# Patient Record
Sex: Female | Born: 1935 | Race: White | Hispanic: No | Smoking: Former smoker
Health system: Southern US, Community
[De-identification: ages and names within clinical notes are randomized; demographics above are authoritative.]

## PROBLEM LIST (undated history)

## (undated) DIAGNOSIS — M1990 Unspecified osteoarthritis, unspecified site: Secondary | ICD-10-CM

## (undated) DIAGNOSIS — K649 Unspecified hemorrhoids: Secondary | ICD-10-CM

## (undated) DIAGNOSIS — I519 Heart disease, unspecified: Secondary | ICD-10-CM

## (undated) DIAGNOSIS — C801 Malignant (primary) neoplasm, unspecified: Secondary | ICD-10-CM

## (undated) DIAGNOSIS — H269 Unspecified cataract: Secondary | ICD-10-CM

## (undated) DIAGNOSIS — J449 Chronic obstructive pulmonary disease, unspecified: Secondary | ICD-10-CM

## (undated) DIAGNOSIS — M810 Age-related osteoporosis without current pathological fracture: Secondary | ICD-10-CM

## (undated) DIAGNOSIS — I10 Essential (primary) hypertension: Secondary | ICD-10-CM

## (undated) DIAGNOSIS — N200 Calculus of kidney: Secondary | ICD-10-CM

## (undated) DIAGNOSIS — G4700 Insomnia, unspecified: Secondary | ICD-10-CM

## (undated) DIAGNOSIS — K30 Functional dyspepsia: Secondary | ICD-10-CM

## (undated) HISTORY — PX: CHOLECYSTECTOMY: SHX55

## (undated) HISTORY — DX: Unspecified hemorrhoids: K64.9

## (undated) HISTORY — DX: Unspecified cataract: H26.9

## (undated) HISTORY — PX: ABDOMINAL HYSTERECTOMY: SHX81

## (undated) HISTORY — DX: Heart disease, unspecified: I51.9

## (undated) HISTORY — DX: Unspecified osteoarthritis, unspecified site: M19.90

## (undated) HISTORY — PX: OTHER SURGICAL HISTORY: SHX169

## (undated) HISTORY — PX: COLONOSCOPY: SHX174

## (undated) HISTORY — DX: Age-related osteoporosis without current pathological fracture: M81.0

## (undated) HISTORY — PX: BREAST BIOPSY: SHX20

## (undated) HISTORY — PX: APPENDECTOMY: SHX54

## (undated) HISTORY — PX: COLON RESECTION: SHX5231

## (undated) HISTORY — DX: Insomnia, unspecified: G47.00

## (undated) HISTORY — PX: TONSILLECTOMY: SUR1361

## (undated) HISTORY — DX: Functional dyspepsia: K30

## (undated) HISTORY — DX: Chronic obstructive pulmonary disease, unspecified: J44.9

---

## 2004-08-11 ENCOUNTER — Ambulatory Visit: Payer: Self-pay | Admitting: Internal Medicine

## 2005-06-27 ENCOUNTER — Ambulatory Visit: Payer: Self-pay | Admitting: Internal Medicine

## 2005-09-04 ENCOUNTER — Ambulatory Visit: Payer: Self-pay | Admitting: Internal Medicine

## 2005-11-30 ENCOUNTER — Ambulatory Visit: Payer: Self-pay

## 2005-12-28 ENCOUNTER — Ambulatory Visit: Payer: Self-pay | Admitting: Internal Medicine

## 2006-09-25 ENCOUNTER — Ambulatory Visit: Payer: Self-pay | Admitting: Internal Medicine

## 2007-01-01 ENCOUNTER — Ambulatory Visit: Payer: Self-pay | Admitting: Unknown Physician Specialty

## 2007-01-07 ENCOUNTER — Ambulatory Visit: Payer: Self-pay | Admitting: Unknown Physician Specialty

## 2007-01-31 ENCOUNTER — Ambulatory Visit: Payer: Self-pay | Admitting: Surgery

## 2007-01-31 ENCOUNTER — Other Ambulatory Visit: Payer: Self-pay

## 2007-02-04 ENCOUNTER — Inpatient Hospital Stay: Payer: Self-pay | Admitting: Surgery

## 2007-09-26 ENCOUNTER — Ambulatory Visit: Payer: Self-pay | Admitting: Internal Medicine

## 2008-09-27 ENCOUNTER — Ambulatory Visit: Payer: Self-pay | Admitting: Internal Medicine

## 2008-11-09 ENCOUNTER — Ambulatory Visit: Payer: Self-pay | Admitting: Unknown Physician Specialty

## 2009-09-29 ENCOUNTER — Ambulatory Visit: Payer: Self-pay | Admitting: Internal Medicine

## 2009-11-15 ENCOUNTER — Ambulatory Visit: Payer: Self-pay | Admitting: Unknown Physician Specialty

## 2010-10-03 ENCOUNTER — Ambulatory Visit: Payer: Self-pay | Admitting: Internal Medicine

## 2010-11-10 ENCOUNTER — Ambulatory Visit: Payer: Self-pay | Admitting: Unknown Physician Specialty

## 2011-05-27 ENCOUNTER — Emergency Department: Payer: Self-pay | Admitting: Emergency Medicine

## 2011-05-27 LAB — URINALYSIS, COMPLETE
Bilirubin,UR: NEGATIVE
Glucose,UR: NEGATIVE mg/dL (ref 0–75)
Leukocyte Esterase: NEGATIVE
Nitrite: NEGATIVE
Ph: 6 (ref 4.5–8.0)
Protein: 100
Specific Gravity: 1.015 (ref 1.003–1.030)
Squamous Epithelial: NONE SEEN
WBC UR: 46 /HPF (ref 0–5)

## 2011-05-27 LAB — CBC
HGB: 13.5 g/dL (ref 12.0–16.0)
MCV: 98 fL (ref 80–100)

## 2011-05-27 LAB — COMPREHENSIVE METABOLIC PANEL
Albumin: 4.1 g/dL (ref 3.4–5.0)
Alkaline Phosphatase: 67 U/L (ref 50–136)
Anion Gap: 8 (ref 7–16)
BUN: 22 mg/dL — ABNORMAL HIGH (ref 7–18)
Chloride: 105 mmol/L (ref 98–107)
Creatinine: 1.09 mg/dL (ref 0.60–1.30)
EGFR (Non-African Amer.): 46 — ABNORMAL LOW
Osmolality: 284 (ref 275–301)
Potassium: 4.3 mmol/L (ref 3.5–5.1)
SGOT(AST): 21 U/L (ref 15–37)
Sodium: 140 mmol/L (ref 136–145)
Total Protein: 7.4 g/dL (ref 6.4–8.2)

## 2011-05-30 ENCOUNTER — Ambulatory Visit: Payer: Self-pay | Admitting: Urology

## 2011-06-13 ENCOUNTER — Ambulatory Visit: Payer: Self-pay | Admitting: Urology

## 2011-10-04 ENCOUNTER — Ambulatory Visit: Payer: Self-pay | Admitting: Internal Medicine

## 2012-01-15 ENCOUNTER — Ambulatory Visit: Payer: Self-pay | Admitting: Unknown Physician Specialty

## 2012-10-06 ENCOUNTER — Ambulatory Visit: Payer: Self-pay | Admitting: Internal Medicine

## 2013-01-14 ENCOUNTER — Ambulatory Visit: Payer: Self-pay | Admitting: Unknown Physician Specialty

## 2013-05-06 DIAGNOSIS — I059 Rheumatic mitral valve disease, unspecified: Secondary | ICD-10-CM | POA: Insufficient documentation

## 2013-05-06 DIAGNOSIS — I10 Essential (primary) hypertension: Secondary | ICD-10-CM | POA: Insufficient documentation

## 2013-05-06 DIAGNOSIS — J449 Chronic obstructive pulmonary disease, unspecified: Secondary | ICD-10-CM | POA: Insufficient documentation

## 2013-06-14 ENCOUNTER — Emergency Department: Payer: Self-pay

## 2013-06-14 LAB — BASIC METABOLIC PANEL
Anion Gap: 3 — ABNORMAL LOW (ref 7–16)
BUN: 26 mg/dL — AB (ref 7–18)
CHLORIDE: 108 mmol/L — AB (ref 98–107)
CO2: 28 mmol/L (ref 21–32)
Calcium, Total: 8.6 mg/dL (ref 8.5–10.1)
Creatinine: 1.07 mg/dL (ref 0.60–1.30)
EGFR (African American): 53 — ABNORMAL LOW
GFR CALC NON AF AMER: 46 — AB
GLUCOSE: 99 mg/dL (ref 65–99)
OSMOLALITY: 282 (ref 275–301)
Potassium: 4.1 mmol/L (ref 3.5–5.1)
SODIUM: 139 mmol/L (ref 136–145)

## 2013-06-14 LAB — CBC
HCT: 37.1 % (ref 35.0–47.0)
HGB: 12.4 g/dL (ref 12.0–16.0)
MCH: 32.5 pg (ref 26.0–34.0)
MCHC: 33.5 g/dL (ref 32.0–36.0)
MCV: 97 fL (ref 80–100)
Platelet: 159 10*3/uL (ref 150–440)
RBC: 3.81 10*6/uL (ref 3.80–5.20)
RDW: 13.6 % (ref 11.5–14.5)
WBC: 7.3 10*3/uL (ref 3.6–11.0)

## 2013-06-14 LAB — URINALYSIS, COMPLETE
BACTERIA: NONE SEEN
BILIRUBIN, UR: NEGATIVE
Blood: NEGATIVE
Glucose,UR: NEGATIVE mg/dL (ref 0–75)
Ketone: NEGATIVE
Nitrite: NEGATIVE
PROTEIN: NEGATIVE
Ph: 6 (ref 4.5–8.0)
Specific Gravity: 1.011 (ref 1.003–1.030)
Squamous Epithelial: <1

## 2013-07-23 ENCOUNTER — Ambulatory Visit: Payer: Self-pay | Admitting: Internal Medicine

## 2013-08-04 DIAGNOSIS — M179 Osteoarthritis of knee, unspecified: Secondary | ICD-10-CM | POA: Insufficient documentation

## 2013-10-12 ENCOUNTER — Ambulatory Visit: Payer: Self-pay | Admitting: Internal Medicine

## 2013-11-18 DIAGNOSIS — M810 Age-related osteoporosis without current pathological fracture: Secondary | ICD-10-CM | POA: Insufficient documentation

## 2014-01-08 DIAGNOSIS — E785 Hyperlipidemia, unspecified: Secondary | ICD-10-CM | POA: Insufficient documentation

## 2014-11-30 ENCOUNTER — Emergency Department
Admission: EM | Admit: 2014-11-30 | Discharge: 2014-11-30 | Disposition: A | Discharge status: Home / Self Care | Payer: Medicare Other | Attending: Emergency Medicine | Admitting: Emergency Medicine

## 2014-11-30 ENCOUNTER — Encounter: Payer: Self-pay | Admitting: Emergency Medicine

## 2014-11-30 DIAGNOSIS — I10 Essential (primary) hypertension: Secondary | ICD-10-CM | POA: Insufficient documentation

## 2014-11-30 DIAGNOSIS — M79602 Pain in left arm: Secondary | ICD-10-CM | POA: Diagnosis not present

## 2014-11-30 HISTORY — DX: Essential (primary) hypertension: I10

## 2014-11-30 HISTORY — DX: Calculus of kidney: N20.0

## 2014-11-30 MED ORDER — IBUPROFEN 400 MG PO TABS
400.0000 mg | ORAL_TABLET | Freq: Once | ORAL | Status: DC
Start: 2014-11-30 — End: 2014-12-01

## 2014-11-30 NOTE — Discharge Instructions (Signed)

## 2014-11-30 NOTE — ED Notes (Signed)
Pt presents to ED with left arm pain. Pt states she was sitting at her kitchen table earlier this evening and rested her left forearm on the edge of the table; pt states she immediately felt pain in her arm.  Pt states she "isn't sure if pain was from the bone or the muscle" but denies hx of the same. Pt states when she looked at her arm after it started to hurt she felt that the vein in that arm appeared to "stick out further than the one on the right" and that her neighbor was concerned about a blood clot. No redness, bruising, or warmth noted. Area painful to touch.

## 2014-11-30 NOTE — ED Notes (Signed)
MD Williams at bedside.  

## 2014-11-30 NOTE — ED Provider Notes (Signed)
[facility] Emergency Department Provider Note     Time seen: ----------------------------------------- 10:20 PM on 11/30/2014 -----------------------------------------    I have reviewed the triage vital signs and the nursing notes.   HISTORY  Chief Complaint Arm Pain    HPI Stacy Holloway is a 81 y.o. female who presents to ER for left arm pain. Patient states she was sitting in her kitchen table earlier this evening resting her left forearm on edge the table and medially she felt pain in her left arm. She wasn't sure if it was in the bone of the muscle, she was concerned this may have had something is due with a heart attack or stroke. She felt the vein in her forearm stick out further than normal she was concerned about blood clot. She denies any other complaints   Past Medical History  Diagnosis Date  . Hypertension   . Kidney stone     There are no active problems to display for this patient.   Past Surgical History  Procedure Laterality Date  . Colon cancer    . Cholecystectomy    . Appendectomy    . Tonsillectomy    . Abdominal hysterectomy      Allergies Sulfa antibiotics  Social History Social History  Substance Use Topics  . Smoking status: Never Smoker   . Smokeless tobacco: None  . Alcohol Use: No    Review of Systems Constitutional: Negative for fever. Eyes: Negative for visual changes. ENT: Negative for sore throat. Cardiovascular: Negative for chest pain. Respiratory: Negative for shortness of breath. Gastrointestinal: Negative for abdominal pain, vomiting and diarrhea. Genitourinary: Negative for dysuria. Musculoskeletal: Positive for left forearm pain Skin: Negative for rash. Neurological: Negative for headaches, focal weakness or numbness.  10-point ROS otherwise negative.  ____________________________________________   PHYSICAL EXAM:  VITAL SIGNS: ED Triage Vitals  Enc Vitals Group     BP 11/30/14  2203 144/66 mmHg     Pulse Rate 11/30/14 2203 59     Resp 11/30/14 2203 18     Temp 11/30/14 2203 97.6 F (36.4 C)     Temp Source 11/30/14 2203 Oral     SpO2 11/30/14 2203 98 %     Weight 11/30/14 2203 167 lb (75.751 kg)     Height 11/30/14 2203 5\' 1"  (1.549 m)     Head Cir --      Peak Flow --      Pain Score 11/30/14 2204 8     Pain Loc --      Pain Edu? --      Excl. in [place]? --     Constitutional: Alert and oriented. Well appearing and in no distress. Eyes: Conjunctivae are normal. PERRL. Normal extraocular movements. ENT   Head: Normocephalic and atraumatic.   Nose: No congestion/rhinnorhea.   Mouth/Throat: Mucous membranes are moist.   Neck: No stridor. Cardiovascular: Normal rate, regular rhythm. Normal and symmetric distal pulses are present in all extremities. No murmurs, rubs, or gallops. Respiratory: Normal respiratory effort without tachypnea nor retractions. Breath sounds are clear and equal bilaterally. No wheezes/rales/rhonchi. Gastrointestinal: Soft and nontender. No distention. No abdominal bruits.  Musculoskeletal: Nontender with normal range of motion in all extremities. No joint effusions.  No lower extremity tenderness nor edema. Neurologic:  Normal speech and language. No gross focal neurologic deficits are appreciated. Speech is normal. No gait instability. Skin:  Skin is warm, dry and intact. No rash noted. Psychiatric: Mood and affect are normal. Speech and  behavior are normal. Patient exhibits appropriate insight and judgment. ____________________________________________  EKG: Interpreted by me. Normal sinus rhythm with a rate of 61 bpm, PACs, normal PR interval, normal QRS with, normal QT interval.  ____________________________________________  ED COURSE:  Pertinent labs & imaging results that were available during my care of the patient were reviewed by me and considered in my medical decision making (see chart for details). Patient has a  normal examination, just needs reassurance. She does not have signs or symptoms of anything emergent.  ____________________________________________  FINAL ASSESSMENT AND PLAN  Left arm pain  Plan: Patient with labs and imaging as dictated above. Patient points to an area on the dorsal aspect of her left forearm distally that is sore when you touch it. It is normal on my examination. She is stable for outpatient follow-up.   Earleen Newport, MD   Earleen Newport, MD 11/30/14 [id number]

## 2014-12-28 ENCOUNTER — Other Ambulatory Visit: Payer: Self-pay | Admitting: Internal Medicine

## 2014-12-28 DIAGNOSIS — Z1231 Encounter for screening mammogram for malignant neoplasm of breast: Secondary | ICD-10-CM

## 2015-01-12 ENCOUNTER — Ambulatory Visit
Admission: RE | Admit: 2015-01-12 | Discharge: 2015-01-12 | Disposition: A | Discharge status: Home / Self Care | Payer: Medicare Other | Source: Ambulatory Visit | Attending: Internal Medicine | Admitting: Internal Medicine

## 2015-01-12 DIAGNOSIS — Z1231 Encounter for screening mammogram for malignant neoplasm of breast: Secondary | ICD-10-CM

## 2015-01-12 HISTORY — DX: Malignant (primary) neoplasm, unspecified: C80.1

## 2015-12-04 DIAGNOSIS — D696 Thrombocytopenia, unspecified: Secondary | ICD-10-CM | POA: Insufficient documentation

## 2015-12-04 NOTE — Progress Notes (Signed)
[place]  Telephone:(336) [phone] Fax:(336) [phone]  ID: Stacy Holloway OB: 07-09-1921  MR#: [id number]  KPT#:[id number]  No care team member to display  CHIEF COMPLAINT: Lymphocytosis, thrombocytopenia  INTERVAL HISTORY: Patient is a 82 year old female who was found to have an elevated white blood cell count with a lymphocytic predominance as well as thrombocytopenia on routine blood work. She is anxious, but otherwise feels well. She has no neurologic complaints. She denies any recent fevers or illnesses. She has a good appetite and denies weight loss. She denies any night sweats. She has no chest pain or shortness of breath. She denies any nausea, vomiting, constipation, or diarrhea. She has no urinary complaints. Patient feels at her baseline and offers no specific complaints today.  REVIEW OF SYSTEMS:   Review of Systems  Constitutional: Negative.  Negative for fever, malaise/fatigue and weight loss.  Respiratory: Negative.  Negative for cough and shortness of breath.   Cardiovascular: Negative.  Negative for chest pain and leg swelling.  Gastrointestinal: Negative.   Genitourinary: Negative.   Musculoskeletal: Negative.   Neurological: Negative for weakness.  Psychiatric/Behavioral: The patient is nervous/anxious.     As per HPI. Otherwise, a complete review of systems is negative.  PAST MEDICAL HISTORY: Past Medical History:  Diagnosis Date  . Arthritis   . Cancer [facility])    colon cancer 2009  . Cataract   . COPD (chronic obstructive pulmonary disease) ([place])   . Heart disease   . Hemorrhoids   . Hypertension   . Indigestion   . Insomnia   . Kidney stone   . Osteoporosis     PAST SURGICAL HISTORY: Past Surgical History:  Procedure Laterality Date  . ABDOMINAL HYSTERECTOMY    . APPENDECTOMY    . BREAST BIOPSY Left    negative 1982  . CHOLECYSTECTOMY    . colon cancer    . COLON RESECTION    . COLONOSCOPY  2008, 2010  . TONSILLECTOMY        FAMILY HISTORY: Family History  Problem Relation Age of Onset  . Breast cancer Neg Hx     ADVANCED DIRECTIVES (Y/N):  N  HEALTH MAINTENANCE: Social History  Substance Use Topics  . Smoking status: Former Research scientist (life sciences)  . Smokeless tobacco: Never Used  . Alcohol use No     Colonoscopy:  PAP:  Bone density:  Lipid panel:  Allergies  Allergen Reactions  . Other Shortness Of Breath  . Sulfa Antibiotics Anaphylaxis    Current Outpatient Prescriptions  Medication Sig Dispense Refill  . aspirin EC 81 MG tablet Take 81 mg by mouth daily.    . carvedilol (COREG) 6.25 MG tablet Take 6.25 mg by mouth 2 (two) times daily with a meal.    . Cholecalciferol (VITAMIN D3) 400 units CAPS Take 2 capsules by mouth daily.    Marland Kitchen docusate sodium (COLACE) 100 MG capsule Take 100 mg by mouth daily as needed for mild constipation.    . furosemide (LASIX) 20 MG tablet Take 20 mg by mouth as needed.    . pantoprazole (PROTONIX) 40 MG tablet Take 40 mg by mouth daily as needed.    . ramipril (ALTACE) 5 MG capsule Take 5 mg by mouth daily.    . vitamin B-12 (CYANOCOBALAMIN) 1000 MCG tablet Take 1,000 mcg by mouth daily.     No current facility-administered medications for this visit.     OBJECTIVE: Vitals:   12/06/15 1133  BP: 138/63  Pulse: 61  Resp:  18  Temp: 97.8 F (36.6 C)     Body mass index is 31.78 kg/m.    ECOG FS:0 - Asymptomatic  General: Well-developed, well-nourished, no acute distress. Eyes: Pink conjunctiva, anicteric sclera. HEENT: Normocephalic, moist mucous membranes, clear oropharnyx. Lungs: Clear to auscultation bilaterally. Heart: Regular rate and rhythm. No rubs, murmurs, or gallops. Abdomen: Soft, nontender, nondistended. No organomegaly noted, normoactive bowel sounds. Musculoskeletal: No edema, cyanosis, or clubbing. Neuro: Alert, answering all questions appropriately. Cranial nerves grossly intact. Skin: No rashes or petechiae noted. Psych: Normal  affect. Lymphatics: No cervical, calvicular, axillary or inguinal LAD.   LAB RESULTS:  Lab Results  Component Value Date   NA 139 06/14/2013   K 4.1 06/14/2013   CL 108 (H) 06/14/2013   CO2 28 06/14/2013   GLUCOSE 99 06/14/2013   BUN 26 (H) 06/14/2013   CREATININE 1.07 06/14/2013   CALCIUM 8.6 06/14/2013   PROT 7.4 05/27/2011   ALBUMIN 4.1 05/27/2011   AST 21 05/27/2011   ALT 22 05/27/2011   ALKPHOS 67 05/27/2011   BILITOT 0.9 05/27/2011   GFRNONAA 46 (L) 06/14/2013   GFRAA 53 (L) 06/14/2013    Lab Results  Component Value Date   WBC 21.2 (H) 12/06/2015   NEUTROABS 2.0 12/06/2015   HGB 11.5 (L) 12/06/2015   HCT 34.1 (L) 12/06/2015   MCV 99.7 12/06/2015   PLT 171 12/06/2015     STUDIES: No results found.  ASSESSMENT: Lymphocytosis, thrombocytopenia  PLAN:   1. Lymphocytosis: Patient has an elevated white blood cell count with lymphocytic predominance. She possibly has underlying CLL and peripheral blood flow cytometry was ordered and is pending at time of dictation. No further intervention is needed at this time. Patient does not require bone marrow biopsy. Return to clinic in 2 months with repeat laboratory work and further evaluation. 2. Thrombocytopenia: Resolved.  Approximately 35 minutes was spent in discussion of which greater than 50% was consultation.  Patient expressed understanding and was in agreement with this plan. She also understands that She can call clinic at any time with any questions, concerns, or complaints.   No matching staging information was found for the patient.  Lloyd Huger, MD   12/07/2015 5:08 PM

## 2015-12-06 ENCOUNTER — Encounter (INDEPENDENT_AMBULATORY_CARE_PROVIDER_SITE_OTHER): Payer: Self-pay

## 2015-12-06 ENCOUNTER — Inpatient Hospital Stay: Payer: Medicare Other | Attending: Oncology | Admitting: Oncology

## 2015-12-06 ENCOUNTER — Encounter: Payer: Self-pay | Admitting: Oncology

## 2015-12-06 ENCOUNTER — Inpatient Hospital Stay: Payer: Medicare Other

## 2015-12-06 VITALS — BP 138/63 | HR 61 | Temp 97.8°F | Resp 18 | Wt 168.2 lb

## 2015-12-06 DIAGNOSIS — Z87442 Personal history of urinary calculi: Secondary | ICD-10-CM | POA: Insufficient documentation

## 2015-12-06 DIAGNOSIS — I10 Essential (primary) hypertension: Secondary | ICD-10-CM

## 2015-12-06 DIAGNOSIS — F419 Anxiety disorder, unspecified: Secondary | ICD-10-CM | POA: Insufficient documentation

## 2015-12-06 DIAGNOSIS — M1990 Unspecified osteoarthritis, unspecified site: Secondary | ICD-10-CM | POA: Diagnosis not present

## 2015-12-06 DIAGNOSIS — D696 Thrombocytopenia, unspecified: Secondary | ICD-10-CM | POA: Diagnosis not present

## 2015-12-06 DIAGNOSIS — J449 Chronic obstructive pulmonary disease, unspecified: Secondary | ICD-10-CM | POA: Insufficient documentation

## 2015-12-06 DIAGNOSIS — D72820 Lymphocytosis (symptomatic): Secondary | ICD-10-CM

## 2015-12-06 DIAGNOSIS — Z7982 Long term (current) use of aspirin: Secondary | ICD-10-CM | POA: Diagnosis not present

## 2015-12-06 DIAGNOSIS — Z87891 Personal history of nicotine dependence: Secondary | ICD-10-CM

## 2015-12-06 DIAGNOSIS — Z79899 Other long term (current) drug therapy: Secondary | ICD-10-CM | POA: Insufficient documentation

## 2015-12-06 LAB — CBC WITH DIFFERENTIAL/PLATELET
BASOS ABS: 0.1 10*3/uL (ref 0–0.1)
BASOS PCT: 0 %
EOS ABS: 0.1 10*3/uL (ref 0–0.7)
Eosinophils Relative: 0 %
HCT: 34.1 % — ABNORMAL LOW (ref 35.0–47.0)
HEMOGLOBIN: 11.5 g/dL — AB (ref 12.0–16.0)
Lymphocytes Relative: 86 %
Lymphs Abs: 18.3 10*3/uL — ABNORMAL HIGH (ref 1.0–3.6)
MCH: 33.6 pg (ref 26.0–34.0)
MCHC: 33.7 g/dL (ref 32.0–36.0)
MCV: 99.7 fL (ref 80.0–100.0)
Monocytes Absolute: 0.8 10*3/uL (ref 0.2–0.9)
Monocytes Relative: 4 %
NEUTROS PCT: 10 %
Neutro Abs: 2 10*3/uL (ref 1.4–6.5)
Platelets: 171 10*3/uL (ref 150–440)
RBC: 3.42 MIL/uL — AB (ref 3.80–5.20)
RDW: 14.4 % (ref 11.5–14.5)
WBC: 21.2 10*3/uL — AB (ref 3.6–11.0)

## 2015-12-06 LAB — IRON AND TIBC
Iron: 80 ug/dL (ref 28–170)
SATURATION RATIOS: 26 % (ref 10.4–31.8)
TIBC: 307 ug/dL (ref 250–450)
UIBC: 227 ug/dL

## 2015-12-06 LAB — FOLATE: FOLATE: 23 ng/mL (ref >5.9)

## 2015-12-06 LAB — LACTATE DEHYDROGENASE: LDH: 174 U/L (ref 98–192)

## 2015-12-06 LAB — FERRITIN: Ferritin: 47 ng/mL (ref 11–307)

## 2015-12-06 LAB — VITAMIN B12: VITAMIN B 12: 1227 pg/mL — AB (ref 180–914)

## 2015-12-06 NOTE — Progress Notes (Signed)
New evaluation for thrombocytopenia. Offers no complaints.

## 2015-12-07 DIAGNOSIS — D72820 Lymphocytosis (symptomatic): Secondary | ICD-10-CM | POA: Insufficient documentation

## 2015-12-09 LAB — COMP PANEL: LEUKEMIA/LYMPHOMA: Immunophenotypic Profile: 71

## 2016-01-09 ENCOUNTER — Telehealth: Payer: Self-pay | Admitting: *Deleted

## 2016-01-09 NOTE — Telephone Encounter (Signed)
Inquiring about lab results. S he states she was told someone would call her with results. Her next appt is not until Jan 11

## 2016-01-09 NOTE — Telephone Encounter (Signed)
Per Dr Grayland Ormond, she has leukemia, but he is not planning to do anything other than watch her for now, no treatment needed at present. Patient informed of results and thanked me for returning her call.

## 2016-02-08 DIAGNOSIS — C9110 Chronic lymphocytic leukemia of B-cell type not having achieved remission: Secondary | ICD-10-CM | POA: Insufficient documentation

## 2016-02-08 NOTE — Progress Notes (Signed)
[place]  Telephone:(336) [phone] Fax:(336) [phone]  ID: Stacy Holloway OB: 1921-08-08  MR#: [id number]  RCB#:[id number]  Patient Care Team: Idelle Crouch, MD as PCP - General (Internal Medicine)  CHIEF COMPLAINT: CLL  INTERVAL HISTORY: Patient returns to clinic today for repeat laboratory work and further evaluation. She currently feels well and is asymptomatic. She has no neurologic complaints. She denies any recent fevers or illnesses. She has a good appetite and denies weight loss. She denies any night sweats. She has no chest pain or shortness of breath. She denies any nausea, vomiting, constipation, or diarrhea. She has no urinary complaints. Patient feels at her baseline and offers no specific complaints today.  REVIEW OF SYSTEMS:   Review of Systems  Constitutional: Negative.  Negative for fever, malaise/fatigue and weight loss.  Respiratory: Negative.  Negative for cough and shortness of breath.   Cardiovascular: Negative.  Negative for chest pain and leg swelling.  Gastrointestinal: Negative.  Negative for abdominal pain.  Genitourinary: Negative.   Musculoskeletal: Negative.   Neurological: Negative.  Negative for weakness.  Psychiatric/Behavioral: Negative.  The patient is not nervous/anxious.     As per HPI. Otherwise, a complete review of systems is negative.  PAST MEDICAL HISTORY: Past Medical History:  Diagnosis Date  . Arthritis   . Cancer [facility])    colon cancer 2009  . Cataract   . COPD (chronic obstructive pulmonary disease) ([place])   . Heart disease   . Hemorrhoids   . Hypertension   . Indigestion   . Insomnia   . Kidney stone   . Osteoporosis     PAST SURGICAL HISTORY: Past Surgical History:  Procedure Laterality Date  . ABDOMINAL HYSTERECTOMY    . APPENDECTOMY    . BREAST BIOPSY Left    negative 1982  . CHOLECYSTECTOMY    . colon cancer    . COLON RESECTION    . COLONOSCOPY  2008, 2010  . TONSILLECTOMY      FAMILY  HISTORY: Family History  Problem Relation Age of Onset  . Breast cancer Neg Hx     ADVANCED DIRECTIVES (Y/N):  N  HEALTH MAINTENANCE: Social History  Substance Use Topics  . Smoking status: Former Research scientist (life sciences)  . Smokeless tobacco: Never Used  . Alcohol use No     Colonoscopy:  PAP:  Bone density:  Lipid panel:  Allergies  Allergen Reactions  . Latex Shortness Of Breath  . Other Shortness Of Breath and Other (See Comments)  . Sulfa Antibiotics Anaphylaxis    Current Outpatient Prescriptions  Medication Sig Dispense Refill  . aspirin EC 81 MG tablet Take 81 mg by mouth daily.    . carvedilol (COREG) 6.25 MG tablet Take 6.25 mg by mouth 2 (two) times daily with a meal.    . Cholecalciferol (VITAMIN D3) 400 units CAPS Take 2 capsules by mouth daily.    Marland Kitchen docusate sodium (COLACE) 100 MG capsule Take 100 mg by mouth daily as needed for mild constipation.    . furosemide (LASIX) 20 MG tablet Take 20 mg by mouth as needed.    . pantoprazole (PROTONIX) 40 MG tablet Take 40 mg by mouth daily as needed.    . ramipril (ALTACE) 5 MG capsule Take 5 mg by mouth daily.    . vitamin B-12 (CYANOCOBALAMIN) 1000 MCG tablet Take 1,000 mcg by mouth daily.     No current facility-administered medications for this visit.     OBJECTIVE: Vitals:   02/09/16 1006  BP: 128/79  Pulse: (!) 114  Resp: 18  Temp: 97.6 F (36.4 C)     Body mass index is 31.43 kg/m.    ECOG FS:0 - Asymptomatic  General: Well-developed, well-nourished, no acute distress. Eyes: Pink conjunctiva, anicteric sclera. HEENT: Normocephalic, moist mucous membranes, clear oropharnyx. Lungs: Clear to auscultation bilaterally. Heart: Regular rate and rhythm. No rubs, murmurs, or gallops. Abdomen: Soft, nontender, nondistended. No organomegaly noted, normoactive bowel sounds. Musculoskeletal: No edema, cyanosis, or clubbing. Neuro: Alert, answering all questions appropriately. Cranial nerves grossly intact. Skin: No rashes or  petechiae noted. Psych: Normal affect. Lymphatics: No cervical, calvicular, axillary or inguinal LAD.   LAB RESULTS:  Lab Results  Component Value Date   NA 139 06/14/2013   K 4.1 06/14/2013   CL 108 (H) 06/14/2013   CO2 28 06/14/2013   GLUCOSE 99 06/14/2013   BUN 26 (H) 06/14/2013   CREATININE 1.07 06/14/2013   CALCIUM 8.6 06/14/2013   PROT 7.4 05/27/2011   ALBUMIN 4.1 05/27/2011   AST 21 05/27/2011   ALT 22 05/27/2011   ALKPHOS 67 05/27/2011   BILITOT 0.9 05/27/2011   GFRNONAA 46 (L) 06/14/2013   GFRAA 53 (L) 06/14/2013    Lab Results  Component Value Date   WBC 18.7 (H) 02/09/2016   NEUTROABS 1.8 02/09/2016   HGB 11.4 (L) 02/09/2016   HCT 33.5 (L) 02/09/2016   MCV 98.4 02/09/2016   PLT 149 (L) 02/09/2016     STUDIES: No results found.  ASSESSMENT: CLL, Rai stage 0  PLAN:   1. CLL: Confirmed by peripheral blood flow cytometry. Patient's white blood cell count remains decreased, but essentially stable. No intervention is needed at this time. Patient does not require bone marrow biopsy. Because of patient's advanced age, she has requested less frequent follow-up therefore will return to clinic in 6 months with laboratory work and further evaluation.  2. Thrombocytopenia: Mild. 3. Anemia: Mild, monitor.  Patient expressed understanding and was in agreement with this plan. She also understands that She can call clinic at any time with any questions, concerns, or complaints.    Lloyd Huger, MD   02/12/2016 8:34 AM

## 2016-02-09 ENCOUNTER — Inpatient Hospital Stay (HOSPITAL_BASED_OUTPATIENT_CLINIC_OR_DEPARTMENT_OTHER): Payer: Medicare Other | Admitting: Oncology

## 2016-02-09 ENCOUNTER — Inpatient Hospital Stay: Payer: Medicare Other | Attending: Oncology

## 2016-02-09 DIAGNOSIS — C9110 Chronic lymphocytic leukemia of B-cell type not having achieved remission: Secondary | ICD-10-CM | POA: Diagnosis not present

## 2016-02-09 DIAGNOSIS — D649 Anemia, unspecified: Secondary | ICD-10-CM

## 2016-02-09 DIAGNOSIS — J449 Chronic obstructive pulmonary disease, unspecified: Secondary | ICD-10-CM | POA: Insufficient documentation

## 2016-02-09 DIAGNOSIS — M810 Age-related osteoporosis without current pathological fracture: Secondary | ICD-10-CM

## 2016-02-09 DIAGNOSIS — D696 Thrombocytopenia, unspecified: Secondary | ICD-10-CM

## 2016-02-09 DIAGNOSIS — Z85038 Personal history of other malignant neoplasm of large intestine: Secondary | ICD-10-CM | POA: Insufficient documentation

## 2016-02-09 DIAGNOSIS — Z7982 Long term (current) use of aspirin: Secondary | ICD-10-CM | POA: Insufficient documentation

## 2016-02-09 DIAGNOSIS — Z87891 Personal history of nicotine dependence: Secondary | ICD-10-CM

## 2016-02-09 DIAGNOSIS — Z79899 Other long term (current) drug therapy: Secondary | ICD-10-CM | POA: Insufficient documentation

## 2016-02-09 DIAGNOSIS — Z87442 Personal history of urinary calculi: Secondary | ICD-10-CM

## 2016-02-09 DIAGNOSIS — I10 Essential (primary) hypertension: Secondary | ICD-10-CM

## 2016-02-09 DIAGNOSIS — G4700 Insomnia, unspecified: Secondary | ICD-10-CM | POA: Insufficient documentation

## 2016-02-09 LAB — CBC WITH DIFFERENTIAL/PLATELET
BASOS ABS: 0.1 10*3/uL (ref 0–0.1)
BASOS PCT: 0 %
EOS ABS: 0.2 10*3/uL (ref 0–0.7)
Eosinophils Relative: 1 %
HEMATOCRIT: 33.5 % — AB (ref 35.0–47.0)
HEMOGLOBIN: 11.4 g/dL — AB (ref 12.0–16.0)
Lymphocytes Relative: 86 %
Lymphs Abs: 16 10*3/uL — ABNORMAL HIGH (ref 1.0–3.6)
MCH: 33.6 pg (ref 26.0–34.0)
MCHC: 34.1 g/dL (ref 32.0–36.0)
MCV: 98.4 fL (ref 80.0–100.0)
MONOS PCT: 3 %
Monocytes Absolute: 0.6 10*3/uL (ref 0.2–0.9)
NEUTROS ABS: 1.8 10*3/uL (ref 1.4–6.5)
NEUTROS PCT: 10 %
Platelets: 149 10*3/uL — ABNORMAL LOW (ref 150–440)
RBC: 3.41 MIL/uL — AB (ref 3.80–5.20)
RDW: 13.6 % (ref 11.5–14.5)
WBC: 18.7 10*3/uL — AB (ref 3.6–11.0)

## 2016-02-09 NOTE — Progress Notes (Signed)
Offers no complaints. Feeling well. 

## 2016-08-03 ENCOUNTER — Other Ambulatory Visit: Payer: Self-pay

## 2016-08-03 DIAGNOSIS — C9110 Chronic lymphocytic leukemia of B-cell type not having achieved remission: Secondary | ICD-10-CM

## 2016-08-07 NOTE — Progress Notes (Signed)
[place]  Telephone:(336) [phone] Fax:(336) [phone]  ID: Stacy Holloway OB: 08-10-1921  MR#: [id number]  QMG#:[id number]  Patient Care Team: Stacy Crouch, MD as PCP - General (Internal Medicine)  CHIEF COMPLAINT: CLL  INTERVAL HISTORY: Patient returns to clinic today for repeat laboratory work and further evaluation. She currently feels well and is asymptomatic. She has no neurologic complaints. She denies any recent fevers or illnesses. She has a good appetite and denies weight loss. She denies any night sweats. She has no chest pain or shortness of breath. She denies any nausea, vomiting, constipation, or diarrhea. She has no urinary complaints. Patient feels at her baseline and offers no specific complaints today.  REVIEW OF SYSTEMS:   Review of Systems  Constitutional: Negative.  Negative for fever, malaise/fatigue and weight loss.  Respiratory: Negative.  Negative for cough and shortness of breath.   Cardiovascular: Negative.  Negative for chest pain and leg swelling.  Gastrointestinal: Negative.  Negative for abdominal pain.  Genitourinary: Negative.   Musculoskeletal: Negative.   Neurological: Negative.  Negative for weakness.  Psychiatric/Behavioral: Negative.  The patient is not nervous/anxious.     As per HPI. Otherwise, a complete review of systems is negative.  PAST MEDICAL HISTORY: Past Medical History:  Diagnosis Date  . Arthritis   . Cancer [facility])    colon cancer 2009  . Cataract   . COPD (chronic obstructive pulmonary disease) ([place])   . Heart disease   . Hemorrhoids   . Hypertension   . Indigestion   . Insomnia   . Kidney stone   . Osteoporosis     PAST SURGICAL HISTORY: Past Surgical History:  Procedure Laterality Date  . ABDOMINAL HYSTERECTOMY    . APPENDECTOMY    . BREAST BIOPSY Left    negative 1982  . CHOLECYSTECTOMY    . colon cancer    . COLON RESECTION    . COLONOSCOPY  2008, 2010  . TONSILLECTOMY       FAMILY HISTORY: Family History  Problem Relation Age of Onset  . Breast cancer Neg Hx     ADVANCED DIRECTIVES (Y/N):  N  HEALTH MAINTENANCE: Social History  Substance Use Topics  . Smoking status: Former Research scientist (life sciences)  . Smokeless tobacco: Never Used  . Alcohol use No     Colonoscopy:  PAP:  Bone density:  Lipid panel:  Allergies  Allergen Reactions  . Latex Shortness Of Breath  . Other Shortness Of Breath and Other (See Comments)  . Sulfa Antibiotics Anaphylaxis    Current Outpatient Prescriptions  Medication Sig Dispense Refill  . aspirin EC 81 MG tablet Take 81 mg by mouth daily.    . carvedilol (COREG) 6.25 MG tablet Take 6.25 mg by mouth 2 (two) times daily with a meal.    . Cholecalciferol (VITAMIN D3) 400 units CAPS Take 2 capsules by mouth daily.    Marland Kitchen docusate sodium (COLACE) 100 MG capsule Take 100 mg by mouth daily as needed for mild constipation.    . furosemide (LASIX) 20 MG tablet Take 20 mg by mouth as needed.    . pantoprazole (PROTONIX) 40 MG tablet Take 40 mg by mouth daily as needed.    . ramipril (ALTACE) 5 MG capsule Take 5 mg by mouth daily.    . vitamin B-12 (CYANOCOBALAMIN) 1000 MCG tablet Take 1,000 mcg by mouth daily.     No current facility-administered medications for this visit.     OBJECTIVE: Vitals:   08/08/16 1016  BP: 118/70  Pulse: (!) 59  Resp: 20  Temp: 97.6 F (36.4 C)     Body mass index is 30.4 kg/m.    ECOG FS:0 - Asymptomatic  General: Well-developed, well-nourished, no acute distress. Eyes: Pink conjunctiva, anicteric sclera. Lungs: Clear to auscultation bilaterally. Heart: Regular rate and rhythm. No rubs, murmurs, or gallops. Abdomen: Soft, nontender, nondistended. No organomegaly noted, normoactive bowel sounds. Musculoskeletal: No edema, cyanosis, or clubbing. Neuro: Alert, answering all questions appropriately. Cranial nerves grossly intact. Skin: No rashes or petechiae noted. Psych: Normal affect. Lymphatics:  No cervical, calvicular, axillary or inguinal LAD.   LAB RESULTS:  Lab Results  Component Value Date   NA 139 06/14/2013   K 4.1 06/14/2013   CL 108 (H) 06/14/2013   CO2 28 06/14/2013   GLUCOSE 99 06/14/2013   BUN 26 (H) 06/14/2013   CREATININE 1.07 06/14/2013   CALCIUM 8.6 06/14/2013   PROT 7.4 05/27/2011   ALBUMIN 4.1 05/27/2011   AST 21 05/27/2011   ALT 22 05/27/2011   ALKPHOS 67 05/27/2011   BILITOT 0.9 05/27/2011   GFRNONAA 46 (L) 06/14/2013   GFRAA 53 (L) 06/14/2013    Lab Results  Component Value Date   WBC 27.9 (H) 08/08/2016   NEUTROABS 2.5 08/08/2016   HGB 10.7 (L) 08/08/2016   HCT 31.2 (L) 08/08/2016   MCV 99.0 08/08/2016   PLT 143 (L) 08/08/2016     STUDIES: No results found.  ASSESSMENT: CLL, Rai stage 0  PLAN:   1. CLL: Confirmed by peripheral blood flow cytometry. Patient's white blood cell count remains increased, but essentially stable. No intervention is needed at this time. Patient does not require bone marrow biopsy. Because of patient's advanced age, she has requested less frequent follow-up therefore will return to clinic in 6 months with laboratory work only and then in one year for laboratory work and further evaluation.  2. Thrombocytopenia: Mild. 3. Anemia: Mild, monitor.  Patient expressed understanding and was in agreement with this plan. She also understands that She can call clinic at any time with any questions, concerns, or complaints.    Stacy Huger, MD   08/10/2016 1:35 PM

## 2016-08-08 ENCOUNTER — Inpatient Hospital Stay: Payer: Medicare Other | Attending: Oncology

## 2016-08-08 ENCOUNTER — Inpatient Hospital Stay (HOSPITAL_BASED_OUTPATIENT_CLINIC_OR_DEPARTMENT_OTHER): Payer: Medicare Other | Admitting: Oncology

## 2016-08-08 VITALS — BP 118/70 | HR 59 | Temp 97.6°F | Resp 20 | Wt 160.9 lb

## 2016-08-08 DIAGNOSIS — D696 Thrombocytopenia, unspecified: Secondary | ICD-10-CM | POA: Insufficient documentation

## 2016-08-08 DIAGNOSIS — C9110 Chronic lymphocytic leukemia of B-cell type not having achieved remission: Secondary | ICD-10-CM | POA: Diagnosis not present

## 2016-08-08 DIAGNOSIS — J449 Chronic obstructive pulmonary disease, unspecified: Secondary | ICD-10-CM | POA: Insufficient documentation

## 2016-08-08 DIAGNOSIS — D649 Anemia, unspecified: Secondary | ICD-10-CM | POA: Insufficient documentation

## 2016-08-08 DIAGNOSIS — Z79899 Other long term (current) drug therapy: Secondary | ICD-10-CM | POA: Insufficient documentation

## 2016-08-08 DIAGNOSIS — Z7982 Long term (current) use of aspirin: Secondary | ICD-10-CM | POA: Diagnosis not present

## 2016-08-08 DIAGNOSIS — Z87891 Personal history of nicotine dependence: Secondary | ICD-10-CM

## 2016-08-08 DIAGNOSIS — Z87442 Personal history of urinary calculi: Secondary | ICD-10-CM | POA: Diagnosis not present

## 2016-08-08 DIAGNOSIS — I10 Essential (primary) hypertension: Secondary | ICD-10-CM | POA: Diagnosis not present

## 2016-08-08 DIAGNOSIS — Z85038 Personal history of other malignant neoplasm of large intestine: Secondary | ICD-10-CM | POA: Diagnosis not present

## 2016-08-08 LAB — CBC WITH DIFFERENTIAL/PLATELET
BASOS PCT: 0 %
Basophils Absolute: 0 10*3/uL (ref 0–0.1)
EOS PCT: 0 %
Eosinophils Absolute: 0 10*3/uL (ref 0–0.7)
HEMATOCRIT: 31.2 % — AB (ref 35.0–47.0)
Hemoglobin: 10.7 g/dL — ABNORMAL LOW (ref 12.0–16.0)
LYMPHS ABS: 24.8 10*3/uL — AB (ref 1.0–3.6)
Lymphocytes Relative: 89 %
MCH: 34.1 pg — ABNORMAL HIGH (ref 26.0–34.0)
MCHC: 34.5 g/dL (ref 32.0–36.0)
MCV: 99 fL (ref 80.0–100.0)
Monocytes Absolute: 0.6 10*3/uL (ref 0.2–0.9)
Monocytes Relative: 2 %
Neutro Abs: 2.5 10*3/uL (ref 1.4–6.5)
Neutrophils Relative %: 9 %
Platelets: 143 10*3/uL — ABNORMAL LOW (ref 150–440)
RBC: 3.15 MIL/uL — AB (ref 3.80–5.20)
RDW: 13.9 % (ref 11.5–14.5)
WBC: 27.9 10*3/uL — AB (ref 3.6–11.0)

## 2016-08-08 NOTE — Progress Notes (Signed)
Patient denies any concerns today.  

## 2017-02-06 ENCOUNTER — Inpatient Hospital Stay: Payer: Medicare Other | Attending: Oncology

## 2017-02-06 DIAGNOSIS — C9110 Chronic lymphocytic leukemia of B-cell type not having achieved remission: Secondary | ICD-10-CM

## 2017-02-06 LAB — CBC WITH DIFFERENTIAL/PLATELET
BASOS ABS: 0.1 10*3/uL (ref 0–0.1)
Basophils Relative: 0 %
Eosinophils Absolute: 0.3 10*3/uL (ref 0–0.7)
Eosinophils Relative: 1 %
HEMATOCRIT: 29.7 % — AB (ref 35.0–47.0)
HEMOGLOBIN: 9.8 g/dL — AB (ref 12.0–16.0)
Lymphocytes Relative: 91 %
Lymphs Abs: 26.1 10*3/uL — ABNORMAL HIGH (ref 1.0–3.6)
MCH: 34.2 pg — ABNORMAL HIGH (ref 26.0–34.0)
MCHC: 32.9 g/dL (ref 32.0–36.0)
MCV: 103.9 fL — ABNORMAL HIGH (ref 80.0–100.0)
Monocytes Absolute: 0.6 10*3/uL (ref 0.2–0.9)
Monocytes Relative: 2 %
NEUTROS ABS: 1.7 10*3/uL (ref 1.4–6.5)
NEUTROS PCT: 6 %
Platelets: 143 10*3/uL — ABNORMAL LOW (ref 150–440)
RBC: 2.85 MIL/uL — AB (ref 3.80–5.20)
RDW: 14.2 % (ref 11.5–14.5)
WBC: 28.8 10*3/uL — ABNORMAL HIGH (ref 3.6–11.0)

## 2017-06-25 IMAGING — MG MM DIGITAL SCREENING BILATERAL
4 series · 4 of 4 positions shown · non-contrast
Comparison: Previous exam(s).

CLINICAL DATA: Screening.

EXAM:
DIGITAL SCREENING BILATERAL MAMMOGRAM WITH CAD

[R MLO]
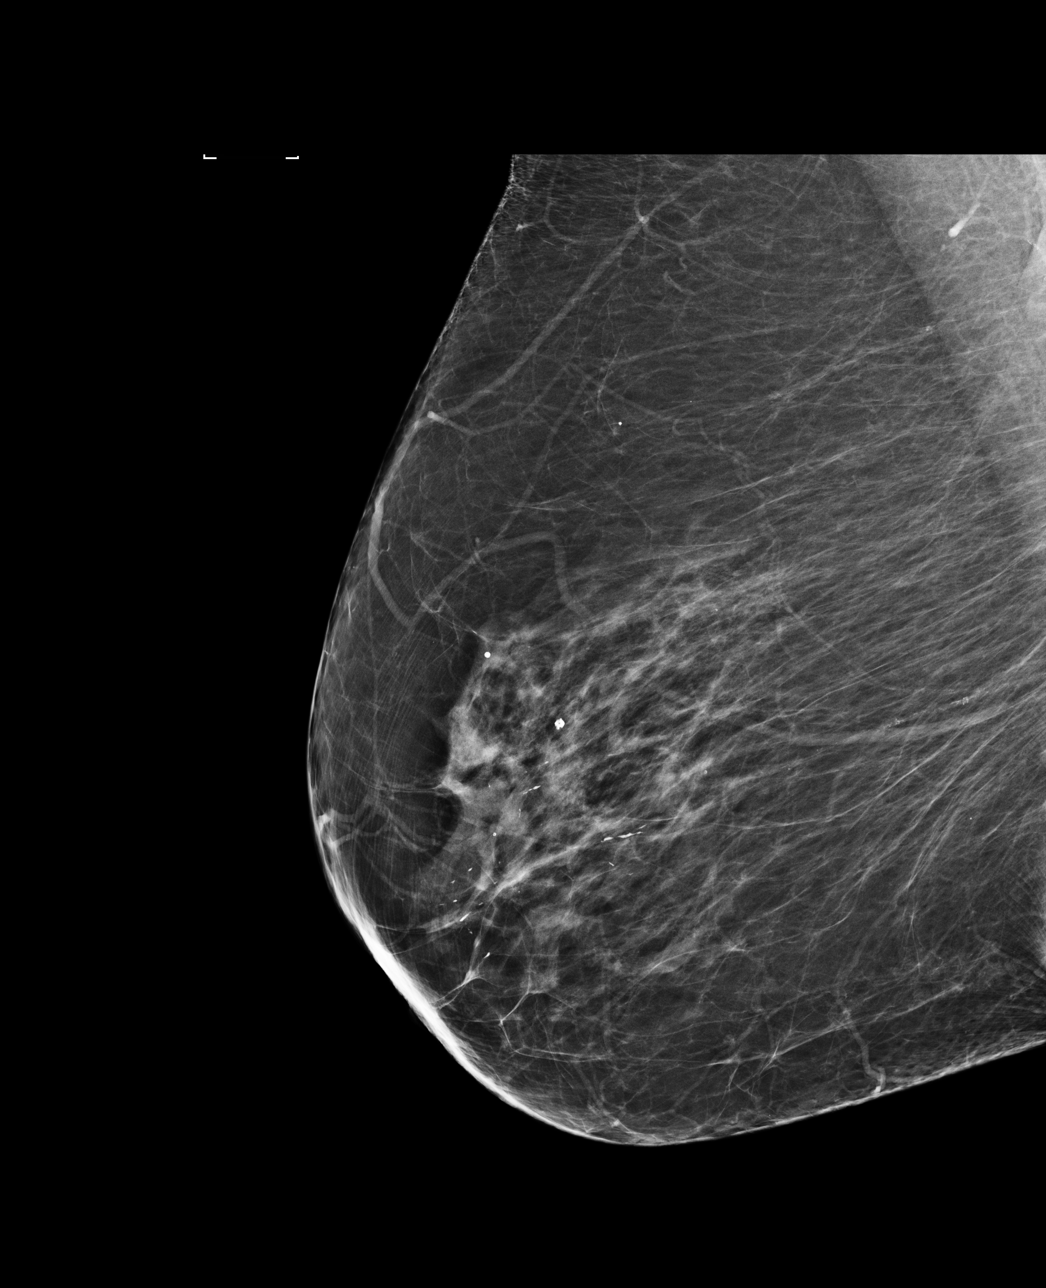

[R CC]
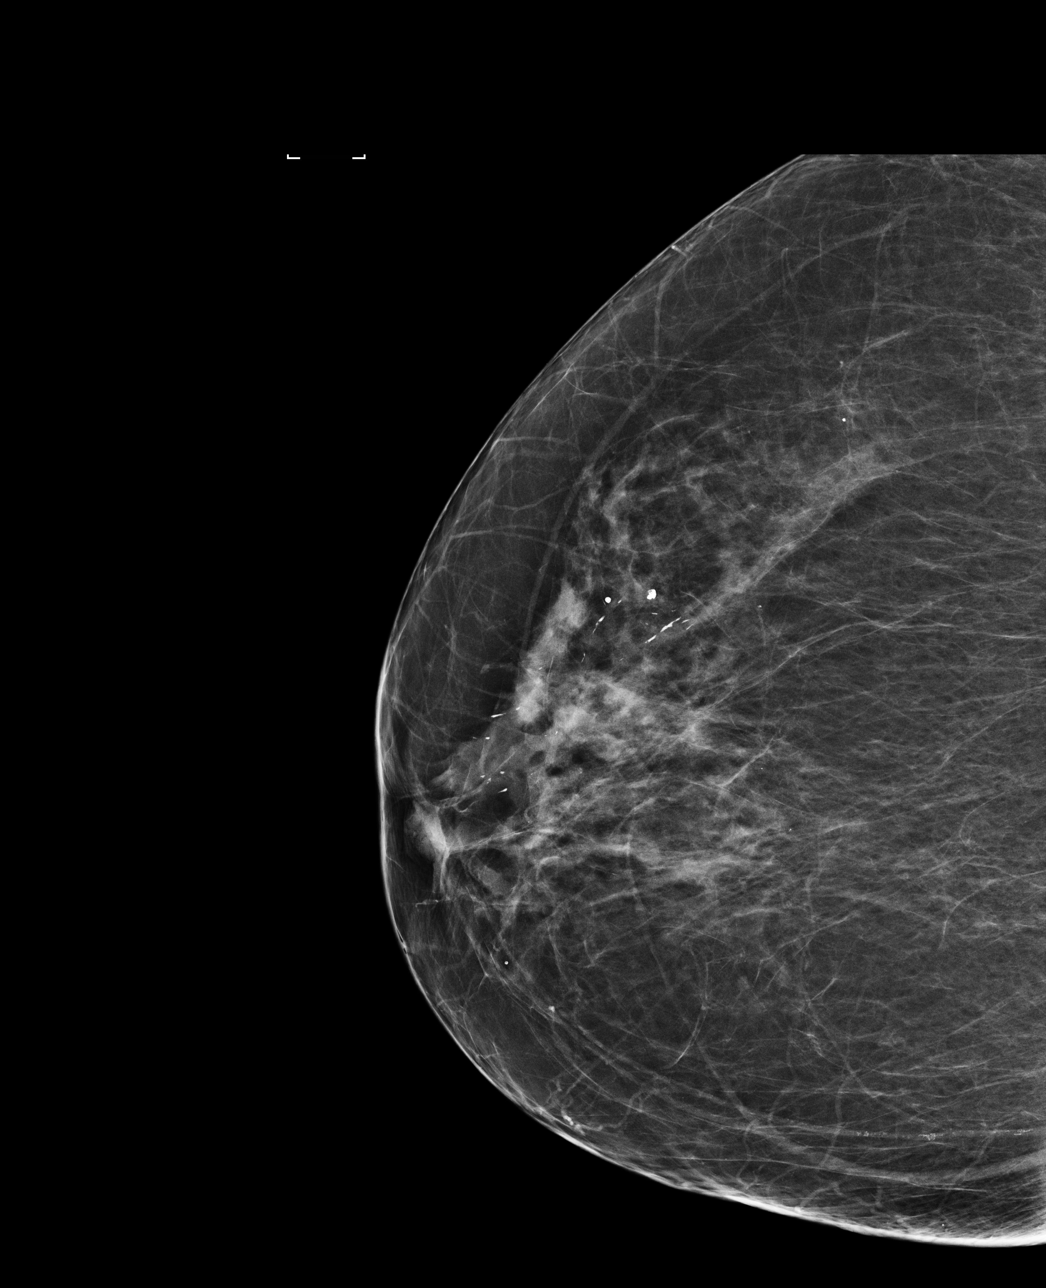

[L MLO]
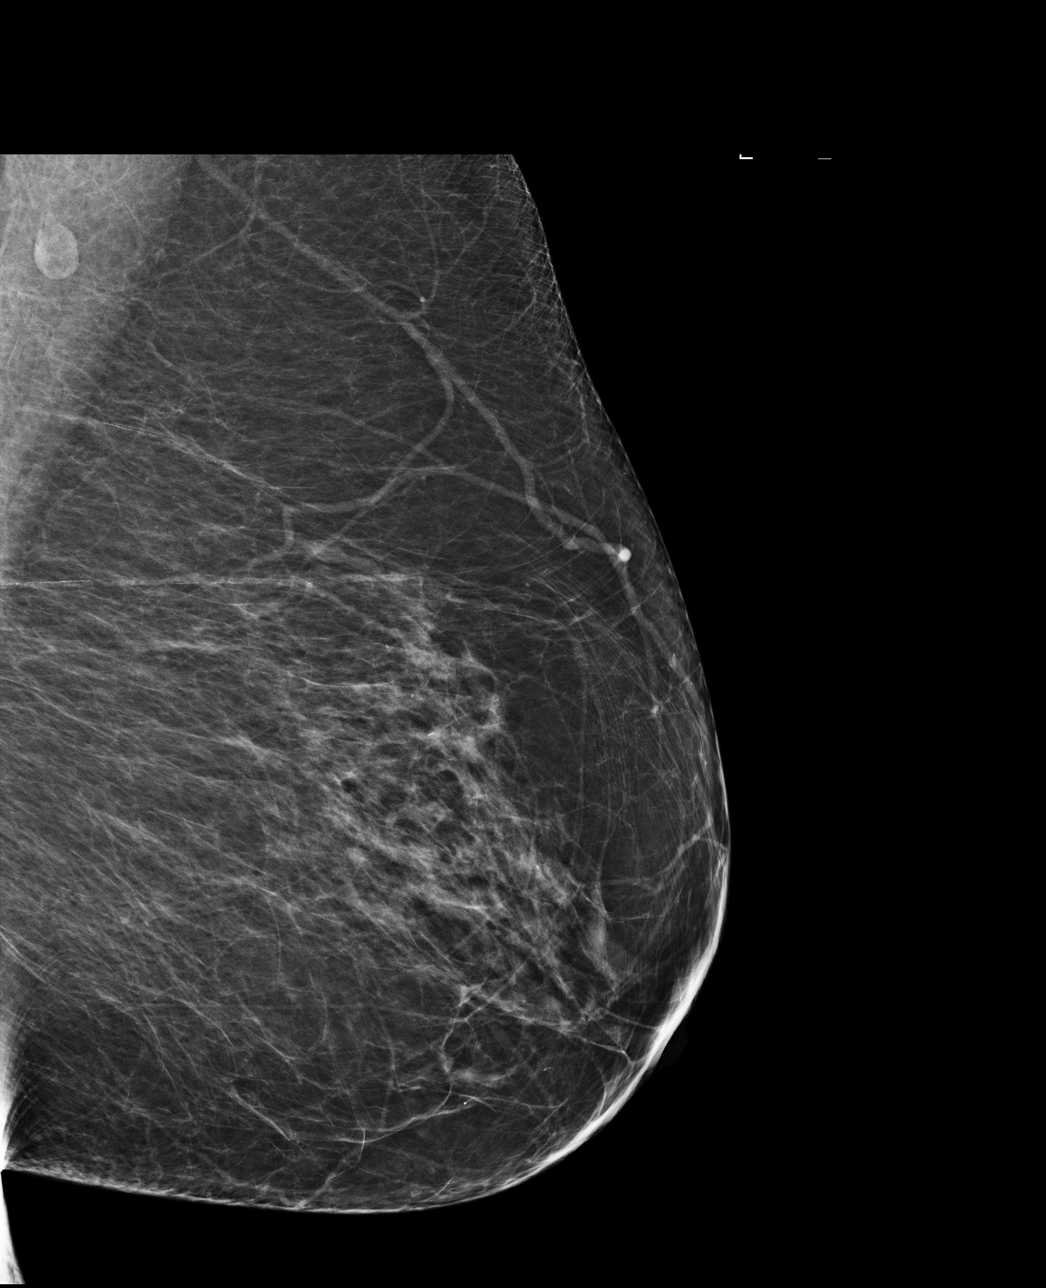

[L CC]
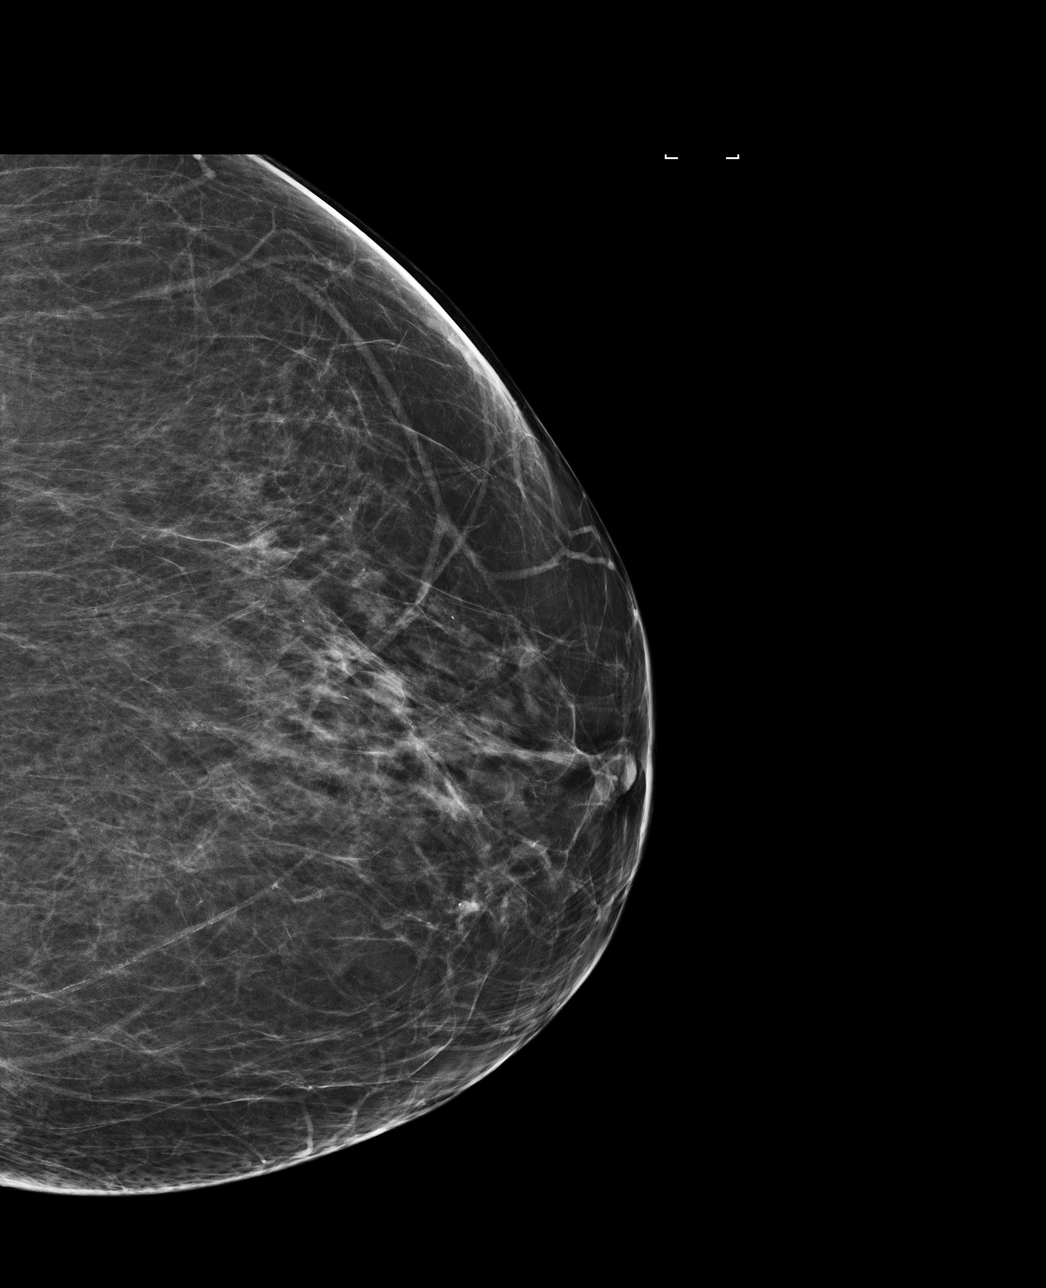

[4 of 4 positions shown; findings below may reference images not displayed]

ACR Breast Density Category b: There are scattered areas of
fibroglandular density.
FINDINGS: There are no findings suspicious for malignancy. Images were
processed with CAD.
IMPRESSION: No mammographic evidence of malignancy. A result letter of this
screening mammogram will be mailed directly to the patient.

RECOMMENDATION:
Screening mammogram in one year. (Code:AS-G-LCT)

BI-RADS CATEGORY  1: Negative.

## 2017-08-04 NOTE — Progress Notes (Signed)
[place]  Telephone:(336) [phone] Fax:(336) [phone]  ID: Stacy Holloway OB: 05/14/1921  MR#: [id number]  UDJ#:[id number]  Patient Care Team: Idelle Crouch, MD as PCP - General (Internal Medicine)  CHIEF COMPLAINT: CLL  INTERVAL HISTORY: Patient returns to clinic today for repeat laboratory work and routine yearly follow-up.  She continues to feel well and remains asymptomatic.  She continues to remain active.  She has no neurologic complaints. She denies any recent fevers or illnesses. She has a good appetite and denies weight loss. She denies any night sweats. She has no chest pain or shortness of breath. She denies any nausea, vomiting, constipation, or diarrhea. She has no urinary complaints.  Patient offers no specific complaints today.  REVIEW OF SYSTEMS:   Review of Systems  Constitutional: Negative.  Negative for fever, malaise/fatigue and weight loss.  Respiratory: Negative.  Negative for cough and shortness of breath.   Cardiovascular: Negative.  Negative for chest pain and leg swelling.  Gastrointestinal: Negative.  Negative for abdominal pain and constipation.  Genitourinary: Negative.  Negative for dysuria.  Musculoskeletal: Negative.  Negative for myalgias.  Skin: Negative.  Negative for rash.  Neurological: Negative.  Negative for focal weakness, weakness and headaches.  Psychiatric/Behavioral: Negative.  The patient is not nervous/anxious.     As per HPI. Otherwise, a complete review of systems is negative.  PAST MEDICAL HISTORY: Past Medical History:  Diagnosis Date  . Arthritis   . Cancer [facility])    colon cancer 2009  . Cataract   . COPD (chronic obstructive pulmonary disease) ([place])   . Heart disease   . Hemorrhoids   . Hypertension   . Indigestion   . Insomnia   . Kidney stone   . Osteoporosis     PAST SURGICAL HISTORY: Past Surgical History:  Procedure Laterality Date  . ABDOMINAL HYSTERECTOMY    . APPENDECTOMY    . BREAST  BIOPSY Left    negative 1982  . CHOLECYSTECTOMY    . colon cancer    . COLON RESECTION    . COLONOSCOPY  2008, 2010  . TONSILLECTOMY      FAMILY HISTORY: Family History  Problem Relation Age of Onset  . Breast cancer Neg Hx     ADVANCED DIRECTIVES (Y/N):  N  HEALTH MAINTENANCE: Social History   Tobacco Use  . Smoking status: Former Research scientist (life sciences)  . Smokeless tobacco: Never Used  Substance Use Topics  . Alcohol use: No  . Drug use: No     Colonoscopy:  PAP:  Bone density:  Lipid panel:  Allergies  Allergen Reactions  . Latex Shortness Of Breath  . Other Shortness Of Breath and Other (See Comments)  . Sulfa Antibiotics Anaphylaxis    Current Outpatient Medications  Medication Sig Dispense Refill  . carvedilol (COREG) 6.25 MG tablet Take 6.25 mg by mouth 2 (two) times daily with a meal.    . Cholecalciferol (VITAMIN D3) 400 units CAPS Take 2 capsules by mouth daily.    Marland Kitchen docusate sodium (COLACE) 100 MG capsule Take 100 mg by mouth daily as needed for mild constipation.    . furosemide (LASIX) 20 MG tablet Take 20 mg by mouth as needed.    . pantoprazole (PROTONIX) 40 MG tablet Take 40 mg by mouth daily as needed.    . ramipril (ALTACE) 5 MG capsule Take 5 mg by mouth daily.    . vitamin B-12 (CYANOCOBALAMIN) 1000 MCG tablet Take 1,000 mcg by mouth daily.  No current facility-administered medications for this visit.     OBJECTIVE: Vitals:   08/07/17 1447  BP: 118/72  Pulse: 69  Resp: 18  Temp: (!) 97.5 F (36.4 C)     Body mass index is 30.1 kg/m.    ECOG FS:0 - Asymptomatic  General: Well-developed, well-nourished, no acute distress. Eyes: Pink conjunctiva, anicteric sclera. HEENT: Normocephalic, moist mucous membranes, clear oropharnyx. Lungs: Clear to auscultation bilaterally. Heart: Regular rate and rhythm. No rubs, murmurs, or gallops. Abdomen: Soft, nontender, nondistended. No organomegaly noted, normoactive bowel sounds. Musculoskeletal: No edema,  cyanosis, or clubbing. Neuro: Alert, answering all questions appropriately. Cranial nerves grossly intact. Skin: No rashes or petechiae noted. Psych: Normal affect. Lymphatics: No cervical, calvicular, axillary or inguinal LAD.   LAB RESULTS:  Lab Results  Component Value Date   NA 139 06/14/2013   K 4.1 06/14/2013   CL 108 (H) 06/14/2013   CO2 28 06/14/2013   GLUCOSE 99 06/14/2013   BUN 26 (H) 06/14/2013   CREATININE 1.07 06/14/2013   CALCIUM 8.6 06/14/2013   PROT 7.4 05/27/2011   ALBUMIN 4.1 05/27/2011   AST 21 05/27/2011   ALT 22 05/27/2011   ALKPHOS 67 05/27/2011   BILITOT 0.9 05/27/2011   GFRNONAA 46 (L) 06/14/2013   GFRAA 53 (L) 06/14/2013    Lab Results  Component Value Date   WBC 42.4 (H) 08/07/2017   NEUTROABS 1.7 08/07/2017   HGB 9.8 (L) 08/07/2017   HCT 28.7 (L) 08/07/2017   MCV 104.8 (H) 08/07/2017   PLT 114 (L) 08/07/2017     STUDIES: No results found.  ASSESSMENT: CLL, Rai stage 0  PLAN:   1. CLL: Confirmed by peripheral blood flow cytometry.  Patient's white blood cell count has increased slightly over her baseline and is now 42.4.  Previously her count was noted to be approximately 27-28.  She also has mildly worsening thrombocytopenia and anemia.  No intervention is needed at this time, but patient may require treatment in the near future.  Return to clinic in 6 months with repeat laboratory work and further evaluation. 2.  Thrombocytopenia: Slightly worse than previous, monitor.  Her platelet count has trended down from 171-114 over the past 2 years. 3.  Anemia: Patient's hemoglobin has slowly trended down from 11.5-9.8 over the past 2 years.    Patient expressed understanding and was in agreement with this plan. She also understands that She can call clinic at any time with any questions, concerns, or complaints.    Lloyd Huger, MD   08/11/2017 6:55 AM

## 2017-08-07 ENCOUNTER — Encounter: Payer: Self-pay | Admitting: Oncology

## 2017-08-07 ENCOUNTER — Inpatient Hospital Stay (HOSPITAL_BASED_OUTPATIENT_CLINIC_OR_DEPARTMENT_OTHER): Payer: Medicare Other | Admitting: Oncology

## 2017-08-07 ENCOUNTER — Inpatient Hospital Stay: Payer: Medicare Other | Attending: Oncology

## 2017-08-07 VITALS — BP 118/72 | HR 69 | Temp 97.5°F | Resp 18 | Wt 159.3 lb

## 2017-08-07 DIAGNOSIS — M1990 Unspecified osteoarthritis, unspecified site: Secondary | ICD-10-CM

## 2017-08-07 DIAGNOSIS — I10 Essential (primary) hypertension: Secondary | ICD-10-CM | POA: Diagnosis not present

## 2017-08-07 DIAGNOSIS — C9110 Chronic lymphocytic leukemia of B-cell type not having achieved remission: Secondary | ICD-10-CM | POA: Diagnosis not present

## 2017-08-07 DIAGNOSIS — D649 Anemia, unspecified: Secondary | ICD-10-CM

## 2017-08-07 DIAGNOSIS — Z79899 Other long term (current) drug therapy: Secondary | ICD-10-CM

## 2017-08-07 DIAGNOSIS — Z85038 Personal history of other malignant neoplasm of large intestine: Secondary | ICD-10-CM | POA: Diagnosis not present

## 2017-08-07 DIAGNOSIS — Z87891 Personal history of nicotine dependence: Secondary | ICD-10-CM | POA: Insufficient documentation

## 2017-08-07 DIAGNOSIS — D696 Thrombocytopenia, unspecified: Secondary | ICD-10-CM

## 2017-08-07 DIAGNOSIS — J449 Chronic obstructive pulmonary disease, unspecified: Secondary | ICD-10-CM | POA: Insufficient documentation

## 2017-08-07 LAB — CBC WITH DIFFERENTIAL/PLATELET
BASOS ABS: 0.1 10*3/uL (ref 0–0.1)
BASOS PCT: 0 %
EOS ABS: 0.1 10*3/uL (ref 0–0.7)
Eosinophils Relative: 0 %
HCT: 28.7 % — ABNORMAL LOW (ref 35.0–47.0)
HEMOGLOBIN: 9.8 g/dL — AB (ref 12.0–16.0)
LYMPHS ABS: 39.8 10*3/uL — AB (ref 1.0–3.6)
Lymphocytes Relative: 94 %
MCH: 35.6 pg — AB (ref 26.0–34.0)
MCHC: 34 g/dL (ref 32.0–36.0)
MCV: 104.8 fL — ABNORMAL HIGH (ref 80.0–100.0)
Monocytes Absolute: 0.7 10*3/uL (ref 0.2–0.9)
Monocytes Relative: 2 %
NEUTROS PCT: 4 %
Neutro Abs: 1.7 10*3/uL (ref 1.4–6.5)
PLATELETS: 114 10*3/uL — AB (ref 150–440)
RBC: 2.74 MIL/uL — AB (ref 3.80–5.20)
RDW: 14.8 % — ABNORMAL HIGH (ref 11.5–14.5)
WBC: 42.4 10*3/uL — AB (ref 3.6–11.0)

## 2017-08-07 NOTE — Progress Notes (Signed)
Pt in for 1 year follow up.  Denies any difficulties other than "arthritis".

## 2017-11-24 NOTE — Progress Notes (Signed)
[place]  Telephone:(336) [phone] Fax:(336) [phone]  ID: Stacy Holloway OB: 1921-07-20  MR#: [id number]  JIR#:[id number]  Patient Care Team: Idelle Crouch, MD as PCP - General (Internal Medicine)  CHIEF COMPLAINT: CLL  INTERVAL HISTORY: Patient returns to clinic today for repeat laboratory work and further evaluation.  Her white blood cell count has been noted to rise recently and is now greater than 50.  She continues to feel well and remains asymptomatic. She has no neurologic complaints.  She denies any fevers or night sweats.  She has a good appetite and denies weight loss. She has no chest pain or shortness of breath. She denies any nausea, vomiting, constipation, or diarrhea. She has no urinary complaints.  Patient offers no specific complaints today.  REVIEW OF SYSTEMS:   Review of Systems  Constitutional: Negative.  Negative for fever, malaise/fatigue and weight loss.  Respiratory: Negative.  Negative for cough and shortness of breath.   Cardiovascular: Negative.  Negative for chest pain and leg swelling.  Gastrointestinal: Negative.  Negative for abdominal pain and constipation.  Genitourinary: Negative.  Negative for dysuria.  Musculoskeletal: Negative.  Negative for myalgias.  Skin: Negative.  Negative for rash.  Neurological: Negative.  Negative for focal weakness, weakness and headaches.  Psychiatric/Behavioral: Negative.  The patient is not nervous/anxious.     As per HPI. Otherwise, a complete review of systems is negative.  PAST MEDICAL HISTORY: Past Medical History:  Diagnosis Date  . Arthritis   . Cancer [facility])    colon cancer 2009  . Cataract   . COPD (chronic obstructive pulmonary disease) ([place])   . Heart disease   . Hemorrhoids   . Hypertension   . Indigestion   . Insomnia   . Kidney stone   . Osteoporosis     PAST SURGICAL HISTORY: Past Surgical History:  Procedure Laterality Date  . ABDOMINAL HYSTERECTOMY    .  APPENDECTOMY    . BREAST BIOPSY Left    negative 1982  . CHOLECYSTECTOMY    . colon cancer    . COLON RESECTION    . COLONOSCOPY  2008, 2010  . TONSILLECTOMY      FAMILY HISTORY: Family History  Problem Relation Age of Onset  . Breast cancer Neg Hx     ADVANCED DIRECTIVES (Y/N):  N  HEALTH MAINTENANCE: Social History   Tobacco Use  . Smoking status: Former Research scientist (life sciences)  . Smokeless tobacco: Never Used  Substance Use Topics  . Alcohol use: No  . Drug use: No     Colonoscopy:  PAP:  Bone density:  Lipid panel:  Allergies  Allergen Reactions  . Latex Shortness Of Breath  . Other Shortness Of Breath and Other (See Comments)  . Sulfa Antibiotics Anaphylaxis    Current Outpatient Medications  Medication Sig Dispense Refill  . carvedilol (COREG) 6.25 MG tablet Take 6.25 mg by mouth 2 (two) times daily with a meal.    . Cholecalciferol (VITAMIN D3) 400 units CAPS Take 2 capsules by mouth daily.    Marland Kitchen docusate sodium (COLACE) 100 MG capsule Take 100 mg by mouth daily as needed for mild constipation.    . furosemide (LASIX) 20 MG tablet Take 20 mg by mouth as needed.    . pantoprazole (PROTONIX) 40 MG tablet Take 40 mg by mouth daily as needed.    . ramipril (ALTACE) 5 MG capsule Take 5 mg by mouth daily.    . vitamin B-12 (CYANOCOBALAMIN) 1000 MCG tablet Take  1,000 mcg by mouth daily.     No current facility-administered medications for this visit.     OBJECTIVE: Vitals:   11/26/17 1346  BP: 136/69  Pulse: (!) 54  Resp: 20  Temp: 98.5 F (36.9 C)     Body mass index is 30.27 kg/m.    ECOG FS:0 - Asymptomatic  General: Well-developed, well-nourished, no acute distress. Eyes: Pink conjunctiva, anicteric sclera. HEENT: Normocephalic, moist mucous membranes. Lungs: Clear to auscultation bilaterally. Heart: Regular rate and rhythm. No rubs, murmurs, or gallops. Abdomen: Soft, nontender, nondistended. No organomegaly noted, normoactive bowel sounds. Musculoskeletal:  No edema, cyanosis, or clubbing. Neuro: Alert, answering all questions appropriately. Cranial nerves grossly intact. Skin: No rashes or petechiae noted. Psych: Normal affect.   LAB RESULTS:  Lab Results  Component Value Date   NA 139 06/14/2013   K 4.1 06/14/2013   CL 108 (H) 06/14/2013   CO2 28 06/14/2013   GLUCOSE 99 06/14/2013   BUN 26 (H) 06/14/2013   CREATININE 1.07 06/14/2013   CALCIUM 8.6 06/14/2013   PROT 7.4 05/27/2011   ALBUMIN 4.1 05/27/2011   AST 21 05/27/2011   ALT 22 05/27/2011   ALKPHOS 67 05/27/2011   BILITOT 0.9 05/27/2011   GFRNONAA 46 (L) 06/14/2013   GFRAA 53 (L) 06/14/2013    Lab Results  Component Value Date   WBC 56.1 (HH) 11/26/2017   NEUTROABS 1.5 (L) 11/26/2017   HGB 8.6 (L) 11/26/2017   HCT 27.2 (L) 11/26/2017   MCV 104.6 (H) 11/26/2017   PLT 114 (L) 11/26/2017     STUDIES: No results found.  ASSESSMENT: CLL, Rai stage 0  PLAN:   1. CLL: Confirmed by peripheral blood flow cytometry.  Patient's white blood cell count continues to trend up and her doubling time is now approximately 9 months.  Her hemoglobin is trending down.  She continues to have thrombocytopenia, but this is relatively stable.  Patient does not require treatment at this time, but most likely will need to initiate single agent Rituxan in the near future.  Return to clinic in 3 months with repeat laboratory work and further evaluation.  2.  Thrombocytopenia: Chronic and unchanged.  3.  Anemia: Patient's hemoglobin is trending down.  She will likely require Rituxan in the near future as above.  Patient expressed understanding and was in agreement with this plan. She also understands that She can call clinic at any time with any questions, concerns, or complaints.    Lloyd Huger, MD   11/29/2017 11:29 AM

## 2017-11-26 ENCOUNTER — Inpatient Hospital Stay (HOSPITAL_BASED_OUTPATIENT_CLINIC_OR_DEPARTMENT_OTHER): Payer: Medicare Other | Admitting: Oncology

## 2017-11-26 ENCOUNTER — Encounter: Payer: Self-pay | Admitting: Oncology

## 2017-11-26 ENCOUNTER — Inpatient Hospital Stay: Payer: Medicare Other | Attending: Oncology

## 2017-11-26 VITALS — BP 136/69 | HR 54 | Temp 98.5°F | Resp 20 | Wt 160.2 lb

## 2017-11-26 DIAGNOSIS — C9110 Chronic lymphocytic leukemia of B-cell type not having achieved remission: Secondary | ICD-10-CM | POA: Diagnosis present

## 2017-11-26 DIAGNOSIS — I10 Essential (primary) hypertension: Secondary | ICD-10-CM | POA: Diagnosis not present

## 2017-11-26 DIAGNOSIS — D649 Anemia, unspecified: Secondary | ICD-10-CM | POA: Insufficient documentation

## 2017-11-26 DIAGNOSIS — Z87891 Personal history of nicotine dependence: Secondary | ICD-10-CM | POA: Insufficient documentation

## 2017-11-26 DIAGNOSIS — D696 Thrombocytopenia, unspecified: Secondary | ICD-10-CM | POA: Diagnosis not present

## 2017-11-26 DIAGNOSIS — M1990 Unspecified osteoarthritis, unspecified site: Secondary | ICD-10-CM

## 2017-11-26 DIAGNOSIS — Z85038 Personal history of other malignant neoplasm of large intestine: Secondary | ICD-10-CM | POA: Diagnosis not present

## 2017-11-26 DIAGNOSIS — Z79899 Other long term (current) drug therapy: Secondary | ICD-10-CM

## 2017-11-26 DIAGNOSIS — M810 Age-related osteoporosis without current pathological fracture: Secondary | ICD-10-CM | POA: Insufficient documentation

## 2017-11-26 DIAGNOSIS — J449 Chronic obstructive pulmonary disease, unspecified: Secondary | ICD-10-CM | POA: Diagnosis not present

## 2017-11-26 LAB — CBC WITH DIFFERENTIAL/PLATELET
ABS IMMATURE GRANULOCYTES: 0.04 10*3/uL (ref 0.00–0.07)
Basophils Absolute: 0 10*3/uL (ref 0.0–0.1)
Basophils Relative: 0 %
Eosinophils Absolute: 0.1 10*3/uL (ref 0.0–0.5)
Eosinophils Relative: 0 %
HEMATOCRIT: 27.2 % — AB (ref 36.0–46.0)
Hemoglobin: 8.6 g/dL — ABNORMAL LOW (ref 12.0–15.0)
IMMATURE GRANULOCYTES: 0 %
LYMPHS PCT: 95 %
Lymphs Abs: 53.5 10*3/uL — ABNORMAL HIGH (ref 0.7–4.0)
MCH: 33.1 pg (ref 26.0–34.0)
MCHC: 31.6 g/dL (ref 30.0–36.0)
MCV: 104.6 fL — AB (ref 80.0–100.0)
MONO ABS: 0.9 10*3/uL (ref 0.1–1.0)
Monocytes Relative: 2 %
NEUTROS ABS: 1.5 10*3/uL — AB (ref 1.7–7.7)
Neutrophils Relative %: 3 %
Platelets: 114 10*3/uL — ABNORMAL LOW (ref 150–400)
RBC: 2.6 MIL/uL — ABNORMAL LOW (ref 3.87–5.11)
RDW: 14.2 % (ref 11.5–15.5)
WBC: 56.1 10*3/uL (ref 4.0–10.5)
nRBC: 0 % (ref 0.0–0.2)

## 2017-11-26 NOTE — Progress Notes (Signed)
Patient here today for follow up regarding CLL.  

## 2018-02-13 ENCOUNTER — Ambulatory Visit: Payer: Medicare Other | Admitting: Oncology

## 2018-02-13 ENCOUNTER — Other Ambulatory Visit: Payer: Medicare Other

## 2018-02-28 NOTE — Progress Notes (Deleted)
[place]  Telephone:(336) [phone] Fax:(336) [phone]  ID: STEPHANIA MACFARLANE OB: 16-Apr-1921  MR#: [id number]  NLZ#:[id number]  Patient Care Team: Idelle Crouch, MD as PCP - General (Internal Medicine)  CHIEF COMPLAINT: CLL  INTERVAL HISTORY: Patient returns to clinic today for repeat laboratory work and further evaluation.  Her white blood cell count has been noted to rise recently and is now greater than 50.  She continues to feel well and remains asymptomatic. She has no neurologic complaints.  She denies any fevers or night sweats.  She has a good appetite and denies weight loss. She has no chest pain or shortness of breath. She denies any nausea, vomiting, constipation, or diarrhea. She has no urinary complaints.  Patient offers no specific complaints today.  REVIEW OF SYSTEMS:   Review of Systems  Constitutional: Negative.  Negative for fever, malaise/fatigue and weight loss.  Respiratory: Negative.  Negative for cough and shortness of breath.   Cardiovascular: Negative.  Negative for chest pain and leg swelling.  Gastrointestinal: Negative.  Negative for abdominal pain and constipation.  Genitourinary: Negative.  Negative for dysuria.  Musculoskeletal: Negative.  Negative for myalgias.  Skin: Negative.  Negative for rash.  Neurological: Negative.  Negative for focal weakness, weakness and headaches.  Psychiatric/Behavioral: Negative.  The patient is not nervous/anxious.     As per HPI. Otherwise, a complete review of systems is negative.  PAST MEDICAL HISTORY: Past Medical History:  Diagnosis Date  . Arthritis   . Cancer [facility])    colon cancer 2009  . Cataract   . COPD (chronic obstructive pulmonary disease) ([place])   . Heart disease   . Hemorrhoids   . Hypertension   . Indigestion   . Insomnia   . Kidney stone   . Osteoporosis     PAST SURGICAL HISTORY: Past Surgical History:  Procedure Laterality Date  . ABDOMINAL HYSTERECTOMY    .  APPENDECTOMY    . BREAST BIOPSY Left    negative 1982  . CHOLECYSTECTOMY    . colon cancer    . COLON RESECTION    . COLONOSCOPY  2008, 2010  . TONSILLECTOMY      FAMILY HISTORY: Family History  Problem Relation Age of Onset  . Breast cancer Neg Hx     ADVANCED DIRECTIVES (Y/N):  N  HEALTH MAINTENANCE: Social History   Tobacco Use  . Smoking status: Former Research scientist (life sciences)  . Smokeless tobacco: Never Used  Substance Use Topics  . Alcohol use: No  . Drug use: No     Colonoscopy:  PAP:  Bone density:  Lipid panel:  Allergies  Allergen Reactions  . Latex Shortness Of Breath  . Other Shortness Of Breath and Other (See Comments)  . Sulfa Antibiotics Anaphylaxis    Current Outpatient Medications  Medication Sig Dispense Refill  . carvedilol (COREG) 6.25 MG tablet Take 6.25 mg by mouth 2 (two) times daily with a meal.    . Cholecalciferol (VITAMIN D3) 400 units CAPS Take 2 capsules by mouth daily.    Marland Kitchen docusate sodium (COLACE) 100 MG capsule Take 100 mg by mouth daily as needed for mild constipation.    . furosemide (LASIX) 20 MG tablet Take 20 mg by mouth as needed.    . pantoprazole (PROTONIX) 40 MG tablet Take 40 mg by mouth daily as needed.    . ramipril (ALTACE) 5 MG capsule Take 5 mg by mouth daily.    . vitamin B-12 (CYANOCOBALAMIN) 1000 MCG tablet Take  1,000 mcg by mouth daily.     No current facility-administered medications for this visit.     OBJECTIVE: There were no vitals filed for this visit.   There is no height or weight on file to calculate BMI.    ECOG FS:0 - Asymptomatic  General: Well-developed, well-nourished, no acute distress. Eyes: Pink conjunctiva, anicteric sclera. HEENT: Normocephalic, moist mucous membranes. Lungs: Clear to auscultation bilaterally. Heart: Regular rate and rhythm. No rubs, murmurs, or gallops. Abdomen: Soft, nontender, nondistended. No organomegaly noted, normoactive bowel sounds. Musculoskeletal: No edema, cyanosis, or  clubbing. Neuro: Alert, answering all questions appropriately. Cranial nerves grossly intact. Skin: No rashes or petechiae noted. Psych: Normal affect.   LAB RESULTS:  Lab Results  Component Value Date   NA 139 06/14/2013   K 4.1 06/14/2013   CL 108 (H) 06/14/2013   CO2 28 06/14/2013   GLUCOSE 99 06/14/2013   BUN 26 (H) 06/14/2013   CREATININE 1.07 06/14/2013   CALCIUM 8.6 06/14/2013   PROT 7.4 05/27/2011   ALBUMIN 4.1 05/27/2011   AST 21 05/27/2011   ALT 22 05/27/2011   ALKPHOS 67 05/27/2011   BILITOT 0.9 05/27/2011   GFRNONAA 46 (L) 06/14/2013   GFRAA 53 (L) 06/14/2013    Lab Results  Component Value Date   WBC 56.1 (HH) 11/26/2017   NEUTROABS 1.5 (L) 11/26/2017   HGB 8.6 (L) 11/26/2017   HCT 27.2 (L) 11/26/2017   MCV 104.6 (H) 11/26/2017   PLT 114 (L) 11/26/2017     STUDIES: No results found.  ASSESSMENT: CLL, Rai stage 0  PLAN:   1. CLL: Confirmed by peripheral blood flow cytometry.  Patient's white blood cell count continues to trend up and her doubling time is now approximately 9 months.  Her hemoglobin is trending down.  She continues to have thrombocytopenia, but this is relatively stable.  Patient does not require treatment at this time, but most likely will need to initiate single agent Rituxan in the near future.  Return to clinic in 3 months with repeat laboratory work and further evaluation.  2.  Thrombocytopenia: Chronic and unchanged.  3.  Anemia: Patient's hemoglobin is trending down.  She will likely require Rituxan in the near future as above.  Patient expressed understanding and was in agreement with this plan. She also understands that She can call clinic at any time with any questions, concerns, or complaints.    Lloyd Huger, MD   02/28/2018 11:48 AM

## 2018-03-06 ENCOUNTER — Other Ambulatory Visit: Payer: Medicare Other

## 2018-03-06 ENCOUNTER — Ambulatory Visit: Payer: Medicare Other | Admitting: Oncology

## 2018-03-07 NOTE — Progress Notes (Signed)
[place]  Telephone:(336) [phone] Fax:(336) [phone]  ID: Stacy Holloway OB: [birth year]  MR#: [id number]  AOZ#:[id number]  Patient Care Team: Idelle Crouch, MD as PCP - General (Internal Medicine)  CHIEF COMPLAINT: CLL  INTERVAL HISTORY: Patient returns to clinic today for repeat laboratory and further evaluation.  She has multiple complaints that are all chronic and unchanged.  She otherwise feels well. She has no neurologic complaints.  She denies any fevers or night sweats.  She has a good appetite and denies weight loss. She has no chest pain or shortness of breath. She denies any nausea, vomiting, constipation, or diarrhea. She has no urinary complaints.  Patient feels at her baseline offers no further specific complaints today.  REVIEW OF SYSTEMS:   Review of Systems  Constitutional: Negative.  Negative for fever, malaise/fatigue and weight loss.  Respiratory: Negative.  Negative for cough and shortness of breath.   Cardiovascular: Negative.  Negative for chest pain and leg swelling.  Gastrointestinal: Negative.  Negative for abdominal pain and constipation.  Genitourinary: Negative.  Negative for dysuria.  Musculoskeletal: Negative.  Negative for myalgias.  Skin: Negative.  Negative for rash.  Neurological: Negative.  Negative for focal weakness, weakness and headaches.  Psychiatric/Behavioral: Negative.  The patient is not nervous/anxious.     As per HPI. Otherwise, a complete review of systems is negative.  PAST MEDICAL HISTORY: Past Medical History:  Diagnosis Date  . Arthritis   . Cancer [facility])    colon cancer 2009  . Cataract   . COPD (chronic obstructive pulmonary disease) ([place])   . Heart disease   . Hemorrhoids   . Hypertension   . Indigestion   . Insomnia   . Kidney stone   . Osteoporosis     PAST SURGICAL HISTORY: Past Surgical History:  Procedure Laterality Date  . ABDOMINAL HYSTERECTOMY    . APPENDECTOMY    . BREAST BIOPSY  Left    negative 1982  . CHOLECYSTECTOMY    . colon cancer    . COLON RESECTION    . COLONOSCOPY  2008, 2010  . TONSILLECTOMY      FAMILY HISTORY: Family History  Problem Relation Age of Onset  . Breast cancer Neg Hx     ADVANCED DIRECTIVES (Y/N):  N  HEALTH MAINTENANCE: Social History   Tobacco Use  . Smoking status: Former Research scientist (life sciences)  . Smokeless tobacco: Never Used  Substance Use Topics  . Alcohol use: No  . Drug use: No     Colonoscopy:  PAP:  Bone density:  Lipid panel:  Allergies  Allergen Reactions  . Latex Shortness Of Breath  . Other Shortness Of Breath and Other (See Comments)  . Sulfa Antibiotics Anaphylaxis    Current Outpatient Medications  Medication Sig Dispense Refill  . carvedilol (COREG) 6.25 MG tablet Take 6.25 mg by mouth 2 (two) times daily with a meal.    . Cholecalciferol (VITAMIN D3) 400 units CAPS Take 2 capsules by mouth daily.    Marland Kitchen docusate sodium (COLACE) 100 MG capsule Take 100 mg by mouth daily as needed for mild constipation.    . furosemide (LASIX) 20 MG tablet Take 20 mg by mouth as needed.    . pantoprazole (PROTONIX) 40 MG tablet Take 40 mg by mouth daily as needed.    . ramipril (ALTACE) 5 MG capsule Take 5 mg by mouth daily.    . vitamin B-12 (CYANOCOBALAMIN) 1000 MCG tablet Take 1,000 mcg by mouth daily.  No current facility-administered medications for this visit.     OBJECTIVE: Vitals:   03/11/18 1031  BP: (!) 155/75  Pulse: 60  Resp: 18  Temp: (!) 97.3 F (36.3 C)     Body mass index is 29.85 kg/m.    ECOG FS:0 - Asymptomatic  General: Well-developed, well-nourished, no acute distress. Eyes: Pink conjunctiva, anicteric sclera. HEENT: Normocephalic, moist mucous membranes. Lungs: Clear to auscultation bilaterally. Heart: Regular rate and rhythm. No rubs, murmurs, or gallops. Abdomen: Soft, nontender, nondistended. No organomegaly noted, normoactive bowel sounds. Musculoskeletal: No edema, cyanosis, or  clubbing. Neuro: Alert, answering all questions appropriately. Cranial nerves grossly intact. Skin: No rashes or petechiae noted. Psych: Normal affect. Lymphatics: No cervical, calvicular, axillary or inguinal LAD.   LAB RESULTS:  Lab Results  Component Value Date   NA 139 06/14/2013   K 4.1 06/14/2013   CL 108 (H) 06/14/2013   CO2 28 06/14/2013   GLUCOSE 99 06/14/2013   BUN 26 (H) 06/14/2013   CREATININE 1.07 06/14/2013   CALCIUM 8.6 06/14/2013   PROT 7.4 05/27/2011   ALBUMIN 4.1 05/27/2011   AST 21 05/27/2011   ALT 22 05/27/2011   ALKPHOS 67 05/27/2011   BILITOT 0.9 05/27/2011   GFRNONAA 46 (L) 06/14/2013   GFRAA 53 (L) 06/14/2013    Lab Results  Component Value Date   WBC 66.2 (HH) 03/11/2018   NEUTROABS 1.4 (L) 03/11/2018   HGB 8.6 (L) 03/11/2018   HCT 27.4 (L) 03/11/2018   MCV 106.2 (H) 03/11/2018   PLT 119 (L) 03/11/2018     STUDIES: No results found.  ASSESSMENT: CLL, Rai stage 0  PLAN:   1. CLL: Confirmed by peripheral blood flow cytometry.  Patient's white blood cell continues to trend up and is now greater than 66.0.  Her hemoglobin is decreased, but stable at 8.6.  She also remains mildly thrombocytopenic.  She does not require treatment at this time, but will likely need single agent Rituxan in the near future.  Return to clinic in 3 months with repeat laboratory work and further evaluation.   2.  Thrombocytopenia: Chronic and unchanged.  Patient's platelet count is 119 today. 3.  Anemia: Hemoglobin decreased, but stable at 8.6.  Monitor.  Patient expressed understanding and was in agreement with this plan. She also understands that She can call clinic at any time with any questions, concerns, or complaints.    Lloyd Huger, MD   03/13/2018 10:23 AM

## 2018-03-11 ENCOUNTER — Other Ambulatory Visit: Payer: Self-pay

## 2018-03-11 ENCOUNTER — Encounter: Payer: Self-pay | Admitting: Oncology

## 2018-03-11 ENCOUNTER — Inpatient Hospital Stay: Payer: Medicare Other | Attending: Oncology

## 2018-03-11 ENCOUNTER — Inpatient Hospital Stay (HOSPITAL_BASED_OUTPATIENT_CLINIC_OR_DEPARTMENT_OTHER): Payer: Medicare Other | Admitting: Oncology

## 2018-03-11 VITALS — BP 155/75 | HR 60 | Temp 97.3°F | Resp 18 | Wt 158.0 lb

## 2018-03-11 DIAGNOSIS — M1990 Unspecified osteoarthritis, unspecified site: Secondary | ICD-10-CM

## 2018-03-11 DIAGNOSIS — D649 Anemia, unspecified: Secondary | ICD-10-CM | POA: Diagnosis not present

## 2018-03-11 DIAGNOSIS — I10 Essential (primary) hypertension: Secondary | ICD-10-CM | POA: Diagnosis not present

## 2018-03-11 DIAGNOSIS — D696 Thrombocytopenia, unspecified: Secondary | ICD-10-CM | POA: Diagnosis not present

## 2018-03-11 DIAGNOSIS — C9110 Chronic lymphocytic leukemia of B-cell type not having achieved remission: Secondary | ICD-10-CM | POA: Diagnosis present

## 2018-03-11 DIAGNOSIS — Z79899 Other long term (current) drug therapy: Secondary | ICD-10-CM

## 2018-03-11 DIAGNOSIS — Z87891 Personal history of nicotine dependence: Secondary | ICD-10-CM

## 2018-03-11 DIAGNOSIS — J449 Chronic obstructive pulmonary disease, unspecified: Secondary | ICD-10-CM | POA: Diagnosis not present

## 2018-03-11 LAB — CBC WITH DIFFERENTIAL/PLATELET
Abs Immature Granulocytes: 0.05 10*3/uL (ref 0.00–0.07)
BASOS ABS: 0.1 10*3/uL (ref 0.0–0.1)
Basophils Relative: 0 %
EOS ABS: 0.1 10*3/uL (ref 0.0–0.5)
EOS PCT: 0 %
HCT: 27.4 % — ABNORMAL LOW (ref 36.0–46.0)
HEMOGLOBIN: 8.6 g/dL — AB (ref 12.0–15.0)
Immature Granulocytes: 0 %
LYMPHS ABS: 63.8 10*3/uL — AB (ref 0.7–4.0)
Lymphocytes Relative: 97 %
MCH: 33.3 pg (ref 26.0–34.0)
MCHC: 31.4 g/dL (ref 30.0–36.0)
MCV: 106.2 fL — ABNORMAL HIGH (ref 80.0–100.0)
Monocytes Absolute: 0.8 10*3/uL (ref 0.1–1.0)
Monocytes Relative: 1 %
NRBC: 0 % (ref 0.0–0.2)
Neutro Abs: 1.4 10*3/uL — ABNORMAL LOW (ref 1.7–7.7)
Neutrophils Relative %: 2 %
Platelets: 119 10*3/uL — ABNORMAL LOW (ref 150–400)
RBC: 2.58 MIL/uL — ABNORMAL LOW (ref 3.87–5.11)
RDW: 14.2 % (ref 11.5–15.5)
WBC: 66.2 10*3/uL (ref 4.0–10.5)

## 2018-03-11 NOTE — Progress Notes (Signed)
Patient denies any concerns today.  

## 2018-03-12 LAB — HEPATITIS B SURFACE ANTIGEN: HEP B S AG: NEGATIVE

## 2018-03-12 LAB — HEPATITIS B SURFACE ANTIBODY, QUANTITATIVE: Hepatitis B-Post: <3.1 m[IU]/mL — ABNORMAL LOW (ref >9.9)

## 2018-06-10 ENCOUNTER — Ambulatory Visit: Payer: Medicare Other | Admitting: Oncology

## 2018-06-10 ENCOUNTER — Other Ambulatory Visit: Payer: Medicare Other

## 2018-09-06 NOTE — Progress Notes (Signed)
[place]  Telephone:(336) [phone] Fax:(336) [phone]  ID: Stacy Holloway OB: 1921/07/09  MR#: [id number]  [id number]#:[id number]  Patient Care Team: Idelle Crouch, MD as PCP - General (Internal Medicine)  CHIEF COMPLAINT: CLL  INTERVAL HISTORY: Patient returns to clinic today for repeat lab work and further evaluation.  She continues to have multiple medical complaints that are all chronic and unchanged.  She admits to increasing weakness and fatigue. She has no neurologic complaints.  She denies any fevers or night sweats.  She has a good appetite and denies weight loss.  She denies any chest pain, shortness of breath, cough, or hemoptysis.  She denies any nausea, vomiting, constipation, or diarrhea. She has no urinary complaints.  Patient offers no further specific complaints today.  REVIEW OF SYSTEMS:   Review of Systems  Constitutional: Positive for malaise/fatigue. Negative for fever and weight loss.  Respiratory: Negative.  Negative for cough and shortness of breath.   Cardiovascular: Negative.  Negative for chest pain and leg swelling.  Gastrointestinal: Negative.  Negative for abdominal pain and constipation.  Genitourinary: Negative.  Negative for dysuria.  Musculoskeletal: Negative.  Negative for myalgias.  Skin: Negative.  Negative for rash.  Neurological: Positive for weakness. Negative for focal weakness and headaches.  Psychiatric/Behavioral: Negative.  The patient is not nervous/anxious.     As per HPI. Otherwise, a complete review of systems is negative.  PAST MEDICAL HISTORY: Past Medical History:  Diagnosis Date  . Arthritis   . Cancer [facility])    colon cancer 2009  . Cataract   . COPD (chronic obstructive pulmonary disease) ([place])   . Heart disease   . Hemorrhoids   . Hypertension   . Indigestion   . Insomnia   . Kidney stone   . Osteoporosis     PAST SURGICAL HISTORY: Past Surgical History:  Procedure Laterality Date  . ABDOMINAL  HYSTERECTOMY    . APPENDECTOMY    . BREAST BIOPSY Left    negative 1982  . CHOLECYSTECTOMY    . colon cancer    . COLON RESECTION    . COLONOSCOPY  2008, 2010  . TONSILLECTOMY      FAMILY HISTORY: Family History  Problem Relation Age of Onset  . Breast cancer Neg Hx     ADVANCED DIRECTIVES (Y/N):  N  HEALTH MAINTENANCE: Social History   Tobacco Use  . Smoking status: Former Research scientist (life sciences)  . Smokeless tobacco: Never Used  Substance Use Topics  . Alcohol use: No  . Drug use: No     Colonoscopy:  PAP:  Bone density:  Lipid panel:  Allergies  Allergen Reactions  . Latex Shortness Of Breath  . Other Shortness Of Breath and Other (See Comments)  . Sulfa Antibiotics Anaphylaxis    Current Outpatient Medications  Medication Sig Dispense Refill  . carvedilol (COREG) 6.25 MG tablet Take 6.25 mg by mouth 2 (two) times daily with a meal.    . Cholecalciferol (VITAMIN D3) 400 units CAPS Take 2 capsules by mouth daily.    Marland Kitchen docusate sodium (COLACE) 100 MG capsule Take 100 mg by mouth daily as needed for mild constipation.    . pantoprazole (PROTONIX) 40 MG tablet Take 40 mg by mouth daily as needed.    . ramipril (ALTACE) 5 MG capsule Take 5 mg by mouth daily.    . vitamin B-12 (CYANOCOBALAMIN) 1000 MCG tablet Take 1,000 mcg by mouth daily.    . furosemide (LASIX) 20 MG tablet Take 20  mg by mouth as needed.     No current facility-administered medications for this visit.     OBJECTIVE: Vitals:   09/11/18 1012 09/11/18 1013  BP:  (!) 120/92  Pulse:  71  Resp: 18 18     Body mass index is 29.32 kg/m.    ECOG FS:0 - Asymptomatic  General: Well-developed, well-nourished, no acute distress. Eyes: Pink conjunctiva, anicteric sclera. HEENT: Normocephalic, moist mucous membranes. Lungs: Clear to auscultation bilaterally. Heart: Regular rate and rhythm. No rubs, murmurs, or gallops. Abdomen: Soft, nontender, nondistended. No organomegaly noted, normoactive bowel sounds.  Musculoskeletal: No edema, cyanosis, or clubbing. Neuro: Alert, answering all questions appropriately. Cranial nerves grossly intact. Skin: No rashes or petechiae noted. Psych: Normal affect.   LAB RESULTS:  Lab Results  Component Value Date   NA 139 06/14/2013   K 4.1 06/14/2013   CL 108 (H) 06/14/2013   CO2 28 06/14/2013   GLUCOSE 99 06/14/2013   BUN 26 (H) 06/14/2013   CREATININE 1.07 06/14/2013   CALCIUM 8.6 06/14/2013   PROT 7.4 05/27/2011   ALBUMIN 4.1 05/27/2011   AST 21 05/27/2011   ALT 22 05/27/2011   ALKPHOS 67 05/27/2011   BILITOT 0.9 05/27/2011   GFRNONAA 46 (L) 06/14/2013   GFRAA 53 (L) 06/14/2013    Lab Results  Component Value Date   WBC 89.8 (HH) 09/11/2018   NEUTROABS 1.8 09/11/2018   HGB 8.1 (L) 09/11/2018   HCT 25.8 (L) 09/11/2018   MCV 108.4 (H) 09/11/2018   PLT 111 (L) 09/11/2018     STUDIES: No results found.  ASSESSMENT: CLL, Rai stage 0  PLAN:   1. CLL: Confirmed by peripheral blood flow cytometry.  Patient white blood cell continues to trend up and is now 89.8.  Her hemoglobin is trending down at 8.1 she also remains mildly thrombocytopenic.  She does not require treatment currently, but likely will in the near future.  Plan to use single agent Rituxan or bio similar.  Return to clinic in 1 month with repeat laboratory work and further evaluation. 2.  Thrombocytopenia: Platelet count is slowly trending down is now 111.  Monitor. 3.  Anemia: Trending down with hemoglobin of 8.1.    Patient expressed understanding and was in agreement with this plan. She also understands that She can call clinic at any time with any questions, concerns, or complaints.    Lloyd Huger, MD   09/11/2018 7:04 PM

## 2018-09-10 ENCOUNTER — Other Ambulatory Visit: Payer: Self-pay

## 2018-09-11 ENCOUNTER — Encounter: Payer: Self-pay | Admitting: Oncology

## 2018-09-11 ENCOUNTER — Other Ambulatory Visit: Payer: Self-pay

## 2018-09-11 ENCOUNTER — Inpatient Hospital Stay (HOSPITAL_BASED_OUTPATIENT_CLINIC_OR_DEPARTMENT_OTHER): Payer: Medicare Other | Admitting: Oncology

## 2018-09-11 ENCOUNTER — Inpatient Hospital Stay: Payer: Medicare Other | Attending: Oncology

## 2018-09-11 VITALS — BP 120/92 | HR 71 | Resp 18 | Wt 155.2 lb

## 2018-09-11 DIAGNOSIS — Z79899 Other long term (current) drug therapy: Secondary | ICD-10-CM | POA: Diagnosis not present

## 2018-09-11 DIAGNOSIS — D649 Anemia, unspecified: Secondary | ICD-10-CM | POA: Insufficient documentation

## 2018-09-11 DIAGNOSIS — J449 Chronic obstructive pulmonary disease, unspecified: Secondary | ICD-10-CM | POA: Insufficient documentation

## 2018-09-11 DIAGNOSIS — Z87891 Personal history of nicotine dependence: Secondary | ICD-10-CM | POA: Diagnosis not present

## 2018-09-11 DIAGNOSIS — M1990 Unspecified osteoarthritis, unspecified site: Secondary | ICD-10-CM | POA: Insufficient documentation

## 2018-09-11 DIAGNOSIS — R5381 Other malaise: Secondary | ICD-10-CM | POA: Diagnosis not present

## 2018-09-11 DIAGNOSIS — R531 Weakness: Secondary | ICD-10-CM | POA: Diagnosis not present

## 2018-09-11 DIAGNOSIS — D696 Thrombocytopenia, unspecified: Secondary | ICD-10-CM | POA: Insufficient documentation

## 2018-09-11 DIAGNOSIS — C9110 Chronic lymphocytic leukemia of B-cell type not having achieved remission: Secondary | ICD-10-CM | POA: Diagnosis not present

## 2018-09-11 DIAGNOSIS — I10 Essential (primary) hypertension: Secondary | ICD-10-CM | POA: Insufficient documentation

## 2018-09-11 DIAGNOSIS — M810 Age-related osteoporosis without current pathological fracture: Secondary | ICD-10-CM | POA: Insufficient documentation

## 2018-09-11 DIAGNOSIS — R5383 Other fatigue: Secondary | ICD-10-CM | POA: Diagnosis not present

## 2018-09-11 LAB — CBC WITH DIFFERENTIAL/PLATELET
Abs Immature Granulocytes: 0 10*3/uL (ref 0.00–0.07)
Basophils Absolute: 0 10*3/uL (ref 0.0–0.1)
Basophils Relative: 0 %
Eosinophils Absolute: 0 10*3/uL (ref 0.0–0.5)
Eosinophils Relative: 0 %
HCT: 25.8 % — ABNORMAL LOW (ref 36.0–46.0)
Hemoglobin: 8.1 g/dL — ABNORMAL LOW (ref 12.0–15.0)
Lymphocytes Relative: 96 %
Lymphs Abs: 86.2 10*3/uL — ABNORMAL HIGH (ref 0.7–4.0)
MCH: 34 pg (ref 26.0–34.0)
MCHC: 31.4 g/dL (ref 30.0–36.0)
MCV: 108.4 fL — ABNORMAL HIGH (ref 80.0–100.0)
Monocytes Absolute: 1.8 10*3/uL — ABNORMAL HIGH (ref 0.1–1.0)
Monocytes Relative: 2 %
Neutro Abs: 1.8 10*3/uL (ref 1.7–7.7)
Neutrophils Relative %: 2 %
Platelets: 111 10*3/uL — ABNORMAL LOW (ref 150–400)
RBC: 2.38 MIL/uL — ABNORMAL LOW (ref 3.87–5.11)
RDW: 14.2 % (ref 11.5–15.5)
Smear Review: NORMAL
WBC Morphology: ABNORMAL
WBC: 89.8 10*3/uL (ref 4.0–10.5)
nRBC: 0 % (ref 0.0–0.2)

## 2018-09-11 NOTE — Progress Notes (Signed)
Patient denies any concerns today. Patient reports she fractured rib in April and she had Covid 19 in February.

## 2018-09-17 ENCOUNTER — Encounter: Payer: Self-pay | Admitting: Oncology

## 2018-10-03 NOTE — Progress Notes (Signed)
[name]  Telephone:(336) [phone] Fax:(336) [phone]  ID: Stacy Holloway OB: 1921-06-12  MR#: [id number]  [id number]  Patient Care Team: Idelle Crouch, MD as PCP - General (Internal Medicine)  CHIEF COMPLAINT: CLL  INTERVAL HISTORY: Patient returns to clinic today for repeat laboratory work and further evaluation.  She was recently diagnosed with pneumonia and placed on antibiotics.  She otherwise feels well and is asymptomatic. She has no neurologic complaints.  She denies any fevers or night sweats.  She has a good appetite and denies weight loss.  She denies any chest pain, shortness of breath, cough, or hemoptysis.  She denies any nausea, vomiting, constipation, or diarrhea. She has no urinary complaints.  Patient offers no further specific complaints today.  REVIEW OF SYSTEMS:   Review of Systems  Constitutional: Positive for malaise/fatigue. Negative for fever and weight loss.  Respiratory: Negative.  Negative for cough and shortness of breath.   Cardiovascular: Negative.  Negative for chest pain and leg swelling.  Gastrointestinal: Negative.  Negative for abdominal pain and constipation.  Genitourinary: Negative.  Negative for dysuria.  Musculoskeletal: Negative.  Negative for myalgias.  Skin: Negative.  Negative for rash.  Neurological: Positive for weakness. Negative for focal weakness and headaches.  Psychiatric/Behavioral: Negative.  The patient is not nervous/anxious.     As per HPI. Otherwise, a complete review of systems is negative.  PAST MEDICAL HISTORY: Past Medical History:  Diagnosis Date   Arthritis    Cancer ([place])    colon cancer 2009   Cataract    COPD (chronic obstructive pulmonary disease) ([place])    Heart disease    Hemorrhoids    Hypertension    Indigestion    Insomnia    Kidney stone    Osteoporosis     PAST SURGICAL HISTORY: Past Surgical History:  Procedure Laterality Date   ABDOMINAL HYSTERECTOMY      APPENDECTOMY     BREAST BIOPSY Left    negative 1982   CHOLECYSTECTOMY     colon cancer     COLON RESECTION     COLONOSCOPY  2008, 2010   TONSILLECTOMY      FAMILY HISTORY: Family History  Problem Relation Age of Onset   Breast cancer Neg Hx     ADVANCED DIRECTIVES (Y/N):  N  HEALTH MAINTENANCE: Social History   Tobacco Use   Smoking status: Former Smoker   Smokeless tobacco: Never Used  Substance Use Topics   Alcohol use: No   Drug use: No     Colonoscopy:  PAP:  Bone density:  Lipid panel:  Allergies  Allergen Reactions   Latex Shortness Of Breath   Other Shortness Of Breath and Other (See Comments)   Sulfa Antibiotics Anaphylaxis    Current Outpatient Medications  Medication Sig Dispense Refill   amoxicillin (AMOXIL) 875 MG tablet Take 875 mg by mouth 2 (two) times daily.     carvedilol (COREG) 6.25 MG tablet Take 6.25 mg by mouth 2 (two) times daily with a meal.     Cholecalciferol (VITAMIN D3) 400 units CAPS Take 2 capsules by mouth daily.     docusate sodium (COLACE) 100 MG capsule Take 100 mg by mouth daily as needed for mild constipation.     furosemide (LASIX) 20 MG tablet Take 20 mg by mouth as needed.     pantoprazole (PROTONIX) 40 MG tablet Take 40 mg by mouth daily as needed.     ramipril (ALTACE) 5 MG capsule Take 5  mg by mouth daily.     vitamin B-12 (CYANOCOBALAMIN) 1000 MCG tablet Take 1,000 mcg by mouth daily.     No current facility-administered medications for this visit.     OBJECTIVE: Vitals:   10/16/18 1029  BP: (!) 121/56  Pulse: (!) 55  Resp: 20  Temp: 97.7 F (36.5 C)     Body mass index is 28.72 kg/m.    ECOG FS:0 - Asymptomatic  General: Well-developed, well-nourished, no acute distress. Eyes: Pink conjunctiva, anicteric sclera. HEENT: Normocephalic, moist mucous membranes. Lungs: Clear to auscultation bilaterally. Heart: Regular rate and rhythm. No rubs, murmurs, or gallops. Abdomen: Soft,  nontender, nondistended. No organomegaly noted, normoactive bowel sounds. Musculoskeletal: No edema, cyanosis, or clubbing. Neuro: Alert, answering all questions appropriately. Cranial nerves grossly intact. Skin: No rashes or petechiae noted. Psych: Normal affect.   LAB RESULTS:  Lab Results  Component Value Date   NA 139 06/14/2013   K 4.1 06/14/2013   CL 108 (H) 06/14/2013   CO2 28 06/14/2013   GLUCOSE 99 06/14/2013   BUN 26 (H) 06/14/2013   CREATININE 1.07 06/14/2013   CALCIUM 8.6 06/14/2013   PROT 7.4 05/27/2011   ALBUMIN 4.1 05/27/2011   AST 21 05/27/2011   ALT 22 05/27/2011   ALKPHOS 67 05/27/2011   BILITOT 0.9 05/27/2011   GFRNONAA 46 (L) 06/14/2013   GFRAA 53 (L) 06/14/2013    Lab Results  Component Value Date   WBC 84.8 (HH) 10/16/2018   NEUTROABS 1.4 (L) 10/16/2018   HGB 7.9 (L) 10/16/2018   HCT 25.1 (L) 10/16/2018   MCV 109.6 (H) 10/16/2018   PLT 107 (L) 10/16/2018     STUDIES: No results found.  ASSESSMENT: CLL, Rai stage 0  PLAN:   1. CLL: Confirmed by peripheral blood flow cytometry.  Patient's white blood cell count is significantly elevated, but has trended down slightly to 84.8.  Her hemoglobin continues to trend down is now 7.9.  Her platelets are decreased, but stable. She does not require treatment currently, but likely will in the near future.  Plan to use single agent Rituxan or bio similar.  Return to clinic in 3 months with repeat laboratory work and further evaluation. 2.  Thrombocytopenia: Platelet count slowly trending down to 107, but remains greater than 100. Monitor. 3.  Anemia: Hemoglobin 7.9 today.  Monitor. 4.  Macrocytosis: Previously B12 and folate were within normal limits.    Patient expressed understanding and was in agreement with this plan. She also understands that She can call clinic at any time with any questions, concerns, or complaints.    Lloyd Huger, MD   10/16/2018 4:27 PM

## 2018-10-16 ENCOUNTER — Other Ambulatory Visit: Payer: Self-pay

## 2018-10-16 ENCOUNTER — Inpatient Hospital Stay: Payer: Medicare Other | Attending: Oncology

## 2018-10-16 ENCOUNTER — Inpatient Hospital Stay (HOSPITAL_BASED_OUTPATIENT_CLINIC_OR_DEPARTMENT_OTHER): Payer: Medicare Other | Admitting: Oncology

## 2018-10-16 VITALS — BP 121/56 | HR 55 | Temp 97.7°F | Resp 20 | Ht 61.0 in | Wt 152.0 lb

## 2018-10-16 DIAGNOSIS — D696 Thrombocytopenia, unspecified: Secondary | ICD-10-CM

## 2018-10-16 DIAGNOSIS — M810 Age-related osteoporosis without current pathological fracture: Secondary | ICD-10-CM | POA: Insufficient documentation

## 2018-10-16 DIAGNOSIS — M1990 Unspecified osteoarthritis, unspecified site: Secondary | ICD-10-CM | POA: Diagnosis not present

## 2018-10-16 DIAGNOSIS — D649 Anemia, unspecified: Secondary | ICD-10-CM | POA: Diagnosis not present

## 2018-10-16 DIAGNOSIS — R5383 Other fatigue: Secondary | ICD-10-CM | POA: Insufficient documentation

## 2018-10-16 DIAGNOSIS — Z87891 Personal history of nicotine dependence: Secondary | ICD-10-CM | POA: Diagnosis not present

## 2018-10-16 DIAGNOSIS — Z79899 Other long term (current) drug therapy: Secondary | ICD-10-CM | POA: Diagnosis not present

## 2018-10-16 DIAGNOSIS — R5381 Other malaise: Secondary | ICD-10-CM | POA: Insufficient documentation

## 2018-10-16 DIAGNOSIS — J449 Chronic obstructive pulmonary disease, unspecified: Secondary | ICD-10-CM | POA: Diagnosis not present

## 2018-10-16 DIAGNOSIS — I10 Essential (primary) hypertension: Secondary | ICD-10-CM | POA: Insufficient documentation

## 2018-10-16 DIAGNOSIS — C9110 Chronic lymphocytic leukemia of B-cell type not having achieved remission: Secondary | ICD-10-CM | POA: Insufficient documentation

## 2018-10-16 DIAGNOSIS — R531 Weakness: Secondary | ICD-10-CM | POA: Diagnosis not present

## 2018-10-16 LAB — CBC WITH DIFFERENTIAL/PLATELET
Abs Immature Granulocytes: 0.05 10*3/uL (ref 0.00–0.07)
Basophils Absolute: 0.1 10*3/uL (ref 0.0–0.1)
Basophils Relative: 0 %
Eosinophils Absolute: 0.1 10*3/uL (ref 0.0–0.5)
Eosinophils Relative: 0 %
HCT: 25.1 % — ABNORMAL LOW (ref 36.0–46.0)
Hemoglobin: 7.9 g/dL — ABNORMAL LOW (ref 12.0–15.0)
Immature Granulocytes: 0 %
Lymphocytes Relative: 98 %
Lymphs Abs: 82.8 10*3/uL — ABNORMAL HIGH (ref 0.7–4.0)
MCH: 34.5 pg — ABNORMAL HIGH (ref 26.0–34.0)
MCHC: 31.5 g/dL (ref 30.0–36.0)
MCV: 109.6 fL — ABNORMAL HIGH (ref 80.0–100.0)
Monocytes Absolute: 0.3 10*3/uL (ref 0.1–1.0)
Monocytes Relative: 0 %
Neutro Abs: 1.4 10*3/uL — ABNORMAL LOW (ref 1.7–7.7)
Neutrophils Relative %: 2 %
Platelets: 107 10*3/uL — ABNORMAL LOW (ref 150–400)
RBC: 2.29 MIL/uL — ABNORMAL LOW (ref 3.87–5.11)
RDW: 14.6 % (ref 11.5–15.5)
Smear Review: NORMAL
WBC: 84.8 10*3/uL (ref 4.0–10.5)
nRBC: 0 % (ref 0.0–0.2)

## 2019-01-11 NOTE — Progress Notes (Signed)
[place]  Telephone:(336) [phone] Fax:(336) [phone]  ID: Stacy Holloway OB: [birth year]  MR#: [id number]  [id number]  Patient Care Team: Idelle Crouch, MD as PCP - General (Internal Medicine)  CHIEF COMPLAINT: CLL  INTERVAL HISTORY: Patient returns to clinic today for repeat laboratory can further evaluation.  She has noticed increased weakness and fatigue, but otherwise feels well.  She remains active and lives alone.  She has no neurologic complaints.  She denies any fevers or night sweats.  She has a good appetite and denies weight loss.  She denies any chest pain, shortness of breath, cough, or hemoptysis.  She denies any nausea, vomiting, constipation, or diarrhea. She has no urinary complaints.  Patient offers no further specific complaints today.  REVIEW OF SYSTEMS:   Review of Systems  Constitutional: Positive for malaise/fatigue. Negative for fever and weight loss.  Respiratory: Negative.  Negative for cough and shortness of breath.   Cardiovascular: Negative.  Negative for chest pain and leg swelling.  Gastrointestinal: Negative.  Negative for abdominal pain and constipation.  Genitourinary: Negative.  Negative for dysuria.  Musculoskeletal: Negative.  Negative for myalgias.  Skin: Negative.  Negative for rash.  Neurological: Positive for weakness. Negative for focal weakness and headaches.  Psychiatric/Behavioral: Negative.  The patient is not nervous/anxious.     As per HPI. Otherwise, a complete review of systems is negative.  PAST MEDICAL HISTORY: Past Medical History:  Diagnosis Date  . Arthritis   . Cancer [facility])    colon cancer 2009  . Cataract   . COPD (chronic obstructive pulmonary disease) ([place])   . Heart disease   . Hemorrhoids   . Hypertension   . Indigestion   . Insomnia   . Kidney stone   . Osteoporosis     PAST SURGICAL HISTORY: Past Surgical History:  Procedure Laterality Date  . ABDOMINAL HYSTERECTOMY    .  APPENDECTOMY    . BREAST BIOPSY Left    negative 1982  . CHOLECYSTECTOMY    . colon cancer    . COLON RESECTION    . COLONOSCOPY  2008, 2010  . TONSILLECTOMY      FAMILY HISTORY: Family History  Problem Relation Age of Onset  . Breast cancer Neg Hx     ADVANCED DIRECTIVES (Y/N):  N  HEALTH MAINTENANCE: Social History   Tobacco Use  . Smoking status: Former Research scientist (life sciences)  . Smokeless tobacco: Never Used  Substance Use Topics  . Alcohol use: No  . Drug use: No     Colonoscopy:  PAP:  Bone density:  Lipid panel:  Allergies  Allergen Reactions  . Latex Shortness Of Breath and Other (See Comments)  . Other Shortness Of Breath and Other (See Comments)  . Sulfa Antibiotics Anaphylaxis    Current Outpatient Medications  Medication Sig Dispense Refill  . carvedilol (COREG) 6.25 MG tablet Take 6.25 mg by mouth 2 (two) times daily with a meal.    . Cholecalciferol (VITAMIN D3) 400 units CAPS Take 2 capsules by mouth daily.    Marland Kitchen docusate sodium (COLACE) 100 MG capsule Take 100 mg by mouth daily as needed for mild constipation.    . pantoprazole (PROTONIX) 40 MG tablet Take 40 mg by mouth daily as needed.    . ramipril (ALTACE) 5 MG capsule Take 2.5 mg by mouth daily.    . vitamin B-12 (CYANOCOBALAMIN) 1000 MCG tablet Take 1,000 mcg by mouth daily.    . furosemide (LASIX) 20 MG tablet Take  20 mg by mouth as needed.     No current facility-administered medications for this visit.    OBJECTIVE: Vitals:   01/16/19 0945  BP: (!) 143/59  Pulse: 70  Temp: (!) 97.3 F (36.3 C)     Body mass index is 28.4 kg/m.    ECOG FS:0 - Asymptomatic  .General: Well-developed, well-nourished, no acute distress. Eyes: Pink conjunctiva, anicteric sclera. HEENT: Normocephalic, moist mucous membranes. Lungs: No audible wheezing or coughing. Heart: Regular rate and rhythm. Abdomen: Soft, nontender, no obvious distention. Musculoskeletal: No edema, cyanosis, or clubbing. Neuro: Alert,  answering all questions appropriately. Cranial nerves grossly intact. Skin: No rashes or petechiae noted. Psych: Normal affect.  LAB RESULTS:  Lab Results  Component Value Date   NA 139 06/14/2013   K 4.1 06/14/2013   CL 108 (H) 06/14/2013   CO2 28 06/14/2013   GLUCOSE 99 06/14/2013   BUN 26 (H) 06/14/2013   CREATININE 1.07 06/14/2013   CALCIUM 8.6 06/14/2013   PROT 7.4 05/27/2011   ALBUMIN 4.1 05/27/2011   AST 21 05/27/2011   ALT 22 05/27/2011   ALKPHOS 67 05/27/2011   BILITOT 0.9 05/27/2011   GFRNONAA 46 (L) 06/14/2013   GFRAA 53 (L) 06/14/2013    Lab Results  Component Value Date   WBC 99.4 (HH) 01/16/2019   NEUTROABS 1.0 (L) 01/16/2019   HGB 7.9 (L) 01/16/2019   HCT 27.0 (L) 01/16/2019   MCV 114.9 (H) 01/16/2019   PLT 102 (L) 01/16/2019     STUDIES: No results found.  ASSESSMENT: CLL, Rai stage 0  PLAN:   1. CLL: Confirmed by peripheral blood flow cytometry.  Patient's white blood cell count continues to trend up and is now 99.4.  Both her hemoglobin and platelets are also trending down and are 7.9 and 102 respectively.  Patient does not wish to undergo treatment with Rituxan at this time, but acknowledges this may be necessary in the future.  Return to clinic in 6 weeks with repeat laboratory work and further evaluation.   2.  Thrombocytopenia: Platelet count slowly trending down is now 102.  Monitor. 3.  Anemia: Hemoglobin decreased, but essentially unchanged at 7.9. 4.  Macrocytosis: Previously B12 and folate were within normal limits.    I spent a total of 25 minutes face-to-face with the patient and reviewing chart data of which greater than 50% of the visit was spent in counseling and coordination of care as detailed above.   Patient expressed understanding and was in agreement with this plan. She also understands that She can call clinic at any time with any questions, concerns, or complaints.    Lloyd Huger, MD   01/16/2019 1:50 PM

## 2019-01-15 ENCOUNTER — Other Ambulatory Visit: Payer: Self-pay

## 2019-01-15 ENCOUNTER — Encounter: Payer: Self-pay | Admitting: Oncology

## 2019-01-15 DIAGNOSIS — K635 Polyp of colon: Secondary | ICD-10-CM | POA: Insufficient documentation

## 2019-01-15 DIAGNOSIS — M189 Osteoarthritis of first carpometacarpal joint, unspecified: Secondary | ICD-10-CM | POA: Insufficient documentation

## 2019-01-15 DIAGNOSIS — C189 Malignant neoplasm of colon, unspecified: Secondary | ICD-10-CM | POA: Insufficient documentation

## 2019-01-15 DIAGNOSIS — Z8619 Personal history of other infectious and parasitic diseases: Secondary | ICD-10-CM | POA: Insufficient documentation

## 2019-01-15 DIAGNOSIS — E039 Hypothyroidism, unspecified: Secondary | ICD-10-CM | POA: Insufficient documentation

## 2019-01-16 ENCOUNTER — Inpatient Hospital Stay: Payer: Medicare Other | Attending: Oncology

## 2019-01-16 ENCOUNTER — Other Ambulatory Visit: Payer: Self-pay

## 2019-01-16 ENCOUNTER — Inpatient Hospital Stay (HOSPITAL_BASED_OUTPATIENT_CLINIC_OR_DEPARTMENT_OTHER): Payer: Medicare Other | Admitting: Oncology

## 2019-01-16 VITALS — BP 143/59 | HR 70 | Temp 97.3°F | Wt 150.3 lb

## 2019-01-16 DIAGNOSIS — D649 Anemia, unspecified: Secondary | ICD-10-CM | POA: Insufficient documentation

## 2019-01-16 DIAGNOSIS — I10 Essential (primary) hypertension: Secondary | ICD-10-CM | POA: Diagnosis not present

## 2019-01-16 DIAGNOSIS — D696 Thrombocytopenia, unspecified: Secondary | ICD-10-CM

## 2019-01-16 DIAGNOSIS — R531 Weakness: Secondary | ICD-10-CM | POA: Insufficient documentation

## 2019-01-16 DIAGNOSIS — J449 Chronic obstructive pulmonary disease, unspecified: Secondary | ICD-10-CM | POA: Diagnosis not present

## 2019-01-16 DIAGNOSIS — Z87891 Personal history of nicotine dependence: Secondary | ICD-10-CM | POA: Insufficient documentation

## 2019-01-16 DIAGNOSIS — D7589 Other specified diseases of blood and blood-forming organs: Secondary | ICD-10-CM | POA: Diagnosis not present

## 2019-01-16 DIAGNOSIS — C9110 Chronic lymphocytic leukemia of B-cell type not having achieved remission: Secondary | ICD-10-CM

## 2019-01-16 DIAGNOSIS — M1990 Unspecified osteoarthritis, unspecified site: Secondary | ICD-10-CM | POA: Insufficient documentation

## 2019-01-16 DIAGNOSIS — Z79899 Other long term (current) drug therapy: Secondary | ICD-10-CM | POA: Diagnosis not present

## 2019-01-16 DIAGNOSIS — R5381 Other malaise: Secondary | ICD-10-CM | POA: Insufficient documentation

## 2019-01-16 DIAGNOSIS — R5383 Other fatigue: Secondary | ICD-10-CM | POA: Insufficient documentation

## 2019-01-16 LAB — CBC WITH DIFFERENTIAL/PLATELET
Abs Immature Granulocytes: 0 10*3/uL (ref 0.00–0.07)
Basophils Absolute: 0 10*3/uL (ref 0.0–0.1)
Basophils Relative: 0 %
Eosinophils Absolute: 1 10*3/uL — ABNORMAL HIGH (ref 0.0–0.5)
Eosinophils Relative: 1 %
HCT: 27 % — ABNORMAL LOW (ref 36.0–46.0)
Hemoglobin: 7.9 g/dL — ABNORMAL LOW (ref 12.0–15.0)
Lymphocytes Relative: 98 %
Lymphs Abs: 97.4 10*3/uL — ABNORMAL HIGH (ref 0.7–4.0)
MCH: 33.6 pg (ref 26.0–34.0)
MCHC: 29.3 g/dL — ABNORMAL LOW (ref 30.0–36.0)
MCV: 114.9 fL — ABNORMAL HIGH (ref 80.0–100.0)
Monocytes Absolute: 0 10*3/uL — ABNORMAL LOW (ref 0.1–1.0)
Monocytes Relative: 0 %
Neutro Abs: 1 10*3/uL — ABNORMAL LOW (ref 1.7–7.7)
Neutrophils Relative %: 1 %
Platelets: 102 10*3/uL — ABNORMAL LOW (ref 150–400)
RBC: 2.35 MIL/uL — ABNORMAL LOW (ref 3.87–5.11)
RDW: 14.9 % (ref 11.5–15.5)
Smear Review: NORMAL
WBC Morphology: ABNORMAL
WBC: 99.4 10*3/uL (ref 4.0–10.5)
nRBC: 0 % (ref 0.0–0.2)

## 2019-02-13 NOTE — Progress Notes (Signed)
Stacy Hollow[facility]  Telephone:(336) [phone] Fax:(336) [phone]  ID: Stacy Holloway OB: 07-Oct-1921  MR#: [id number]  [id number]  Patient Care Team: Stacy Crouch, MD as PCP - General (Internal Medicine) Stacy Huger, MD as Consulting Physician (Hematology and Oncology)  CHIEF COMPLAINT: CLL  INTERVAL HISTORY: Patient returns to clinic today for repeat laboratory work, further evaluation, and discussion on whether to initiate treatment for her CLL.  She continues to complain of chronic weakness and fatigue, but otherwise feels well.  She remains active and lives alone.  She has no neurologic complaints.  She denies any fevers or night sweats.  She has a good appetite and denies weight loss.  She denies any chest pain, shortness of breath, cough, or hemoptysis.  She denies any nausea, vomiting, constipation, or diarrhea. She has no urinary complaints.  Patient offers no further specific complaints today.  REVIEW OF SYSTEMS:   Review of Systems  Constitutional: Positive for malaise/fatigue. Negative for fever and weight loss.  Respiratory: Negative.  Negative for cough and shortness of breath.   Cardiovascular: Negative.  Negative for chest pain and leg swelling.  Gastrointestinal: Negative.  Negative for abdominal pain and constipation.  Genitourinary: Negative.  Negative for dysuria.  Musculoskeletal: Negative.  Negative for myalgias.  Skin: Negative.  Negative for rash.  Neurological: Positive for weakness. Negative for focal weakness and headaches.  Psychiatric/Behavioral: Negative.  The patient is not nervous/anxious.     As per HPI. Otherwise, a complete review of systems is negative.  PAST MEDICAL HISTORY: Past Medical History:  Diagnosis Date  . Arthritis   . Cancer [facility])    colon cancer 2009  . Cataract   . COPD (chronic obstructive pulmonary disease) ([place])   . Heart disease   . Hemorrhoids   . Hypertension   . Indigestion   . Insomnia   .  Kidney stone   . Osteoporosis     PAST SURGICAL HISTORY: Past Surgical History:  Procedure Laterality Date  . ABDOMINAL HYSTERECTOMY    . APPENDECTOMY    . BREAST BIOPSY Left    negative 1982  . CHOLECYSTECTOMY    . colon cancer    . COLON RESECTION    . COLONOSCOPY  2008, 2010  . TONSILLECTOMY      FAMILY HISTORY: Family History  Problem Relation Age of Onset  . Breast cancer Neg Hx     ADVANCED DIRECTIVES (Y/N):  N  HEALTH MAINTENANCE: Social History   Tobacco Use  . Smoking status: Former Research scientist (life sciences)  . Smokeless tobacco: Never Used  Substance Use Topics  . Alcohol use: No  . Drug use: No     Colonoscopy:  PAP:  Bone density:  Lipid panel:  Allergies  Allergen Reactions  . Latex Shortness Of Breath and Other (See Comments)  . Other Shortness Of Breath and Other (See Comments)  . Sulfa Antibiotics Anaphylaxis    Current Outpatient Medications  Medication Sig Dispense Refill  . carvedilol (COREG) 6.25 MG tablet Take 6.25 mg by mouth 2 (two) times daily with a meal.    . Cholecalciferol (VITAMIN D3) 400 units CAPS Take 2 capsules by mouth daily.    Marland Kitchen docusate sodium (COLACE) 100 MG capsule Take 100 mg by mouth daily as needed for mild constipation.    . furosemide (LASIX) 20 MG tablet Take 20 mg by mouth as needed.    . mupirocin ointment (BACTROBAN) 2 % mupirocin 2 % topical ointment    . pantoprazole (PROTONIX)  40 MG tablet Take 40 mg by mouth daily as needed.    . ramipril (ALTACE) 5 MG capsule Take 2.5 mg by mouth daily.    . vitamin B-12 (CYANOCOBALAMIN) 1000 MCG tablet Take 1,000 mcg by mouth daily.     No current facility-administered medications for this visit.    OBJECTIVE: Vitals:   02/19/19 1455  BP: (!) 147/60  Pulse: 78  Resp: 18  Temp: (!) 97.4 F (36.3 C)  SpO2: 100%     Body mass index is 28.49 kg/m.    ECOG FS:0 - Asymptomatic  General: Well-developed, well-nourished, no acute distress. Eyes: Pink conjunctiva, anicteric  sclera. HEENT: Normocephalic, moist mucous membranes. Lungs: No audible wheezing or coughing. Heart: Regular rate and rhythm. Abdomen: Soft, nontender, no obvious distention. Musculoskeletal: No edema, cyanosis, or clubbing. Neuro: Alert, answering all questions appropriately. Cranial nerves grossly intact. Skin: No rashes or petechiae noted. Psych: Normal affect.  LAB RESULTS:  Lab Results  Component Value Date   NA 139 06/14/2013   K 4.1 06/14/2013   CL 108 (H) 06/14/2013   CO2 28 06/14/2013   GLUCOSE 99 06/14/2013   BUN 26 (H) 06/14/2013   CREATININE 1.07 06/14/2013   CALCIUM 8.6 06/14/2013   PROT 7.4 05/27/2011   ALBUMIN 4.1 05/27/2011   AST 21 05/27/2011   ALT 22 05/27/2011   ALKPHOS 67 05/27/2011   BILITOT 0.9 05/27/2011   GFRNONAA 46 (L) 06/14/2013   GFRAA 53 (L) 06/14/2013    Lab Results  Component Value Date   WBC 97.6 (HH) 02/19/2019   NEUTROABS 1.3 (L) 02/19/2019   HGB 7.8 (L) 02/19/2019   HCT 26.8 (L) 02/19/2019   MCV 115.0 (H) 02/19/2019   PLT 90 (L) 02/19/2019     STUDIES: No results found.  ASSESSMENT: CLL, Rai stage 0  PLAN:   1. CLL: Confirmed by peripheral blood flow cytometry.  Patient's white blood cell count remains significantly elevated, but relatively unchanged from 6 weeks ago at 97.6.  Both her hemoglobin and platelets have trended down slightly.  Although patient would benefit from treatment with single agent Rituxan, she wishes to delay treatment until she can get the Covid vaccine.  Patient expressed understanding of treatment will likely be necessary in the near future.  Return to clinic in 8 weeks for repeat laboratory work other evaluation.   2.  Thrombocytopenia: Platelet count continues to slowly trend down and is now 90. 3.  Anemia: Hemoglobin decreased, but essentially unchanged at 7.8. 4.  Macrocytosis: Previously B12 and folate were within normal limits.    I spent a total of 30 minutes reviewing chart data, face-to-face  evaluation with the patient, counseling and coordination of care as detailed above.    Patient expressed understanding and was in agreement with this plan. She also understands that She can call clinic at any time with any questions, concerns, or complaints.    Stacy Huger, MD   02/20/2019 11:59 AM

## 2019-02-19 ENCOUNTER — Other Ambulatory Visit: Payer: Self-pay

## 2019-02-19 ENCOUNTER — Encounter: Payer: Self-pay | Admitting: Oncology

## 2019-02-19 ENCOUNTER — Inpatient Hospital Stay (HOSPITAL_BASED_OUTPATIENT_CLINIC_OR_DEPARTMENT_OTHER): Payer: Medicare Other | Admitting: Oncology

## 2019-02-19 ENCOUNTER — Inpatient Hospital Stay: Payer: Medicare Other | Attending: Oncology

## 2019-02-19 VITALS — BP 147/60 | HR 78 | Temp 97.4°F | Resp 18 | Wt 150.8 lb

## 2019-02-19 DIAGNOSIS — Z79899 Other long term (current) drug therapy: Secondary | ICD-10-CM | POA: Diagnosis not present

## 2019-02-19 DIAGNOSIS — M810 Age-related osteoporosis without current pathological fracture: Secondary | ICD-10-CM | POA: Diagnosis not present

## 2019-02-19 DIAGNOSIS — I10 Essential (primary) hypertension: Secondary | ICD-10-CM | POA: Insufficient documentation

## 2019-02-19 DIAGNOSIS — D7589 Other specified diseases of blood and blood-forming organs: Secondary | ICD-10-CM | POA: Insufficient documentation

## 2019-02-19 DIAGNOSIS — R5381 Other malaise: Secondary | ICD-10-CM | POA: Insufficient documentation

## 2019-02-19 DIAGNOSIS — C9110 Chronic lymphocytic leukemia of B-cell type not having achieved remission: Secondary | ICD-10-CM | POA: Diagnosis not present

## 2019-02-19 DIAGNOSIS — D696 Thrombocytopenia, unspecified: Secondary | ICD-10-CM | POA: Diagnosis not present

## 2019-02-19 DIAGNOSIS — D649 Anemia, unspecified: Secondary | ICD-10-CM | POA: Insufficient documentation

## 2019-02-19 DIAGNOSIS — M1990 Unspecified osteoarthritis, unspecified site: Secondary | ICD-10-CM | POA: Insufficient documentation

## 2019-02-19 DIAGNOSIS — J449 Chronic obstructive pulmonary disease, unspecified: Secondary | ICD-10-CM | POA: Insufficient documentation

## 2019-02-19 DIAGNOSIS — R5383 Other fatigue: Secondary | ICD-10-CM | POA: Diagnosis not present

## 2019-02-19 DIAGNOSIS — Z87891 Personal history of nicotine dependence: Secondary | ICD-10-CM | POA: Diagnosis not present

## 2019-02-19 DIAGNOSIS — R531 Weakness: Secondary | ICD-10-CM | POA: Diagnosis not present

## 2019-02-19 LAB — CBC WITH DIFFERENTIAL/PLATELET
Abs Immature Granulocytes: 0.05 10*3/uL (ref 0.00–0.07)
Basophils Absolute: 0 10*3/uL (ref 0.0–0.1)
Basophils Relative: 0 %
Eosinophils Absolute: 0.1 10*3/uL (ref 0.0–0.5)
Eosinophils Relative: 0 %
HCT: 26.8 % — ABNORMAL LOW (ref 36.0–46.0)
Hemoglobin: 7.8 g/dL — ABNORMAL LOW (ref 12.0–15.0)
Immature Granulocytes: 0 %
Lymphocytes Relative: 98 %
Lymphs Abs: 95.3 10*3/uL — ABNORMAL HIGH (ref 0.7–4.0)
MCH: 33.5 pg (ref 26.0–34.0)
MCHC: 29.1 g/dL — ABNORMAL LOW (ref 30.0–36.0)
MCV: 115 fL — ABNORMAL HIGH (ref 80.0–100.0)
Monocytes Absolute: 0.7 10*3/uL (ref 0.1–1.0)
Monocytes Relative: 1 %
Neutro Abs: 1.3 10*3/uL — ABNORMAL LOW (ref 1.7–7.7)
Neutrophils Relative %: 1 %
Platelets: 90 10*3/uL — ABNORMAL LOW (ref 150–400)
RBC: 2.33 MIL/uL — ABNORMAL LOW (ref 3.87–5.11)
RDW: 14.8 % (ref 11.5–15.5)
WBC: 97.6 10*3/uL (ref 4.0–10.5)
nRBC: 0 % (ref 0.0–0.2)

## 2019-02-19 NOTE — Progress Notes (Signed)
Pt in for follow up, pt reports in to discuss treatment options with Dr Grayland Ormond.  Pt reports recently had UTI and completed medications.

## 2019-04-09 NOTE — Progress Notes (Deleted)
[place]  [phone] [phone]) [phone]  ID: ORPHIE GRIGG OB: 1922/01/25  MR#: [id number]  [id number]  Patient Care Team: Idelle Crouch, MD as PCP - General (Internal Medicine) Lloyd Huger, MD as Consulting Physician (Hematology and Oncology)  CHIEF COMPLAINT: CLL  INTERVAL HISTORY: Patient returns to clinic today for repeat laboratory work, further evaluation, and discussion on whether to initiate treatment for her CLL.  She continues to complain of chronic weakness and fatigue, but otherwise feels well.  She remains active and lives alone.  She has no neurologic complaints.  She denies any fevers or night sweats.  She has a good appetite and denies weight loss.  She denies any chest pain, shortness of breath, cough, or hemoptysis.  She denies any nausea, vomiting, constipation, or diarrhea. She has no urinary complaints.  Patient offers no further specific complaints today.  REVIEW OF SYSTEMS:   Review of Systems  Constitutional: Positive for malaise/fatigue. Negative for fever and weight loss.  Respiratory: Negative.  Negative for cough and shortness of breath.   Cardiovascular: Negative.  Negative for chest pain and leg swelling.  Gastrointestinal: Negative.  Negative for abdominal pain and constipation.  Genitourinary: Negative.  Negative for dysuria.  Musculoskeletal: Negative.  Negative for myalgias.  Skin: Negative.  Negative for rash.  Neurological: Positive for weakness. Negative for focal weakness and headaches.  Psychiatric/Behavioral: Negative.  The patient is not nervous/anxious.     As per HPI. Otherwise, a complete review of systems is negative.  PAST MEDICAL HISTORY: Past Medical History:  Diagnosis Date  . Arthritis   . Cancer [facility])    colon cancer 2009  . Cataract   . COPD (chronic obstructive pulmonary disease) ([place])   . Heart disease   . Hemorrhoids   . Hypertension   . Indigestion   . Insomnia   .  Kidney stone   . Osteoporosis     PAST SURGICAL HISTORY: Past Surgical History:  Procedure Laterality Date  . ABDOMINAL HYSTERECTOMY    . APPENDECTOMY    . BREAST BIOPSY Left    negative 1982  . CHOLECYSTECTOMY    . colon cancer    . COLON RESECTION    . COLONOSCOPY  2008, 2010  . TONSILLECTOMY      FAMILY HISTORY: Family History  Problem Relation Age of Onset  . Breast cancer Neg Hx     ADVANCED DIRECTIVES (Y/N):  N  HEALTH MAINTENANCE: Social History   Tobacco Use  . Smoking status: Former Research scientist (life sciences)  . Smokeless tobacco: Never Used  Substance Use Topics  . Alcohol use: No  . Drug use: No     Colonoscopy:  PAP:  Bone density:  Lipid panel:  Allergies  Allergen Reactions  . Latex Shortness Of Breath and Other (See Comments)  . Other Shortness Of Breath and Other (See Comments)  . Sulfa Antibiotics Anaphylaxis    Current Outpatient Medications  Medication Sig Dispense Refill  . carvedilol (COREG) 6.25 MG tablet Take 6.25 mg by mouth 2 (two) times daily with a meal.    . Cholecalciferol (VITAMIN D3) 400 units CAPS Take 2 capsules by mouth daily.    Marland Kitchen docusate sodium (COLACE) 100 MG capsule Take 100 mg by mouth daily as needed for mild constipation.    . furosemide (LASIX) 20 MG tablet Take 20 mg by mouth as needed.    . mupirocin ointment (BACTROBAN) 2 % mupirocin 2 % topical ointment    . pantoprazole (PROTONIX)  40 MG tablet Take 40 mg by mouth daily as needed.    . ramipril (ALTACE) 5 MG capsule Take 2.5 mg by mouth daily.    . vitamin B-12 (CYANOCOBALAMIN) 1000 MCG tablet Take 1,000 mcg by mouth daily.     No current facility-administered medications for this visit.    OBJECTIVE: There were no vitals filed for this visit.   There is no height or weight on file to calculate BMI.    ECOG FS:0 - Asymptomatic  General: Well-developed, well-nourished, no acute distress. Eyes: Pink conjunctiva, anicteric sclera. HEENT: Normocephalic, moist mucous  membranes. Lungs: No audible wheezing or coughing. Heart: Regular rate and rhythm. Abdomen: Soft, nontender, no obvious distention. Musculoskeletal: No edema, cyanosis, or clubbing. Neuro: Alert, answering all questions appropriately. Cranial nerves grossly intact. Skin: No rashes or petechiae noted. Psych: Normal affect.  LAB RESULTS:  Lab Results  Component Value Date   NA 139 06/14/2013   K 4.1 06/14/2013   CL 108 (H) 06/14/2013   CO2 28 06/14/2013   GLUCOSE 99 06/14/2013   BUN 26 (H) 06/14/2013   CREATININE 1.07 06/14/2013   CALCIUM 8.6 06/14/2013   PROT 7.4 05/27/2011   ALBUMIN 4.1 05/27/2011   AST 21 05/27/2011   ALT 22 05/27/2011   ALKPHOS 67 05/27/2011   BILITOT 0.9 05/27/2011   GFRNONAA 46 (L) 06/14/2013   GFRAA 53 (L) 06/14/2013    Lab Results  Component Value Date   WBC 97.6 (HH) 02/19/2019   NEUTROABS 1.3 (L) 02/19/2019   HGB 7.8 (L) 02/19/2019   HCT 26.8 (L) 02/19/2019   MCV 115.0 (H) 02/19/2019   PLT 90 (L) 02/19/2019     STUDIES: No results found.  ASSESSMENT: CLL, Rai stage 0  PLAN:   1. CLL: Confirmed by peripheral blood flow cytometry.  Patient's white blood cell count remains significantly elevated, but relatively unchanged from 6 weeks ago at 97.6.  Both her hemoglobin and platelets have trended down slightly.  Although patient would benefit from treatment with single agent Rituxan, she wishes to delay treatment until she can get the Covid vaccine.  Patient expressed understanding of treatment will likely be necessary in the near future.  Return to clinic in 8 weeks for repeat laboratory work other evaluation.   2.  Thrombocytopenia: Platelet count continues to slowly trend down and is now 90. 3.  Anemia: Hemoglobin decreased, but essentially unchanged at 7.8. 4.  Macrocytosis: Previously B12 and folate were within normal limits.    I spent a total of 30 minutes reviewing chart data, face-to-face evaluation with the patient, counseling and  coordination of care as detailed above.    Patient expressed understanding and was in agreement with this plan. She also understands that She can call clinic at any time with any questions, concerns, or complaints.    Lloyd Huger, MD   04/09/2019 5:59 PM

## 2019-04-16 ENCOUNTER — Inpatient Hospital Stay: Payer: Medicare Other | Admitting: Oncology

## 2019-04-16 ENCOUNTER — Inpatient Hospital Stay: Payer: Medicare Other

## 2019-04-17 NOTE — Progress Notes (Signed)
[place]  [phone] [phone]) [phone]  ID: Stacy Holloway OB: March 11, 1921  MR#: [id number]  [id number]  Patient Care Team: Idelle Crouch, MD as PCP - General (Internal Medicine) Lloyd Huger, MD as Consulting Physician (Hematology and Oncology)  CHIEF COMPLAINT: CLL  INTERVAL HISTORY: Patient returns to clinic today for repeat laboratory work, further evaluation, and treatment planning.  She continues to have chronic weakness and fatigue, but otherwise feels well.  Spite this, she remains active and continues to live alone.  She has no neurologic complaints.  She denies any fevers or night sweats.  She has a good appetite and denies weight loss.  She denies any chest pain, shortness of breath, cough, or hemoptysis.  She denies any nausea, vomiting, constipation, or diarrhea. She has no urinary complaints.  Patient offers no further specific complaints today.  REVIEW OF SYSTEMS:   Review of Systems  Constitutional: Positive for malaise/fatigue. Negative for fever and weight loss.  Respiratory: Negative.  Negative for cough and shortness of breath.   Cardiovascular: Negative.  Negative for chest pain and leg swelling.  Gastrointestinal: Positive for heartburn. Negative for abdominal pain and constipation.  Genitourinary: Negative.  Negative for dysuria.  Musculoskeletal: Negative.  Negative for myalgias.  Skin: Negative.  Negative for rash.  Neurological: Positive for weakness. Negative for focal weakness and headaches.  Psychiatric/Behavioral: Negative.  The patient is not nervous/anxious.     As per HPI. Otherwise, a complete review of systems is negative.  PAST MEDICAL HISTORY: Past Medical History:  Diagnosis Date  . Arthritis   . Cancer [facility])    colon cancer 2009  . Cataract   . COPD (chronic obstructive pulmonary disease) ([place])   . Heart disease   . Hemorrhoids   . Hypertension   . Indigestion   . Insomnia   . Kidney  stone   . Osteoporosis     PAST SURGICAL HISTORY: Past Surgical History:  Procedure Laterality Date  . ABDOMINAL HYSTERECTOMY    . APPENDECTOMY    . BREAST BIOPSY Left    negative 1982  . CHOLECYSTECTOMY    . colon cancer    . COLON RESECTION    . COLONOSCOPY  2008, 2010  . TONSILLECTOMY      FAMILY HISTORY: Family History  Problem Relation Age of Onset  . Breast cancer Neg Hx     ADVANCED DIRECTIVES (Y/N):  N  HEALTH MAINTENANCE: Social History   Tobacco Use  . Smoking status: Former Research scientist (life sciences)  . Smokeless tobacco: Never Used  Substance Use Topics  . Alcohol use: No  . Drug use: No     Colonoscopy:  PAP:  Bone density:  Lipid panel:  Allergies  Allergen Reactions  . Latex Shortness Of Breath and Other (See Comments)  . Other Shortness Of Breath and Other (See Comments)  . Sulfa Antibiotics Anaphylaxis    Current Outpatient Medications  Medication Sig Dispense Refill  . amLODipine (NORVASC) 2.5 MG tablet Take 2.5 mg by mouth daily.    . carvedilol (COREG) 6.25 MG tablet Take 6.25 mg by mouth 2 (two) times daily with a meal.    . Cholecalciferol (VITAMIN D3) 400 units CAPS Take 2 capsules by mouth daily.    Marland Kitchen docusate sodium (COLACE) 100 MG capsule Take 100 mg by mouth daily as needed for mild constipation.    . furosemide (LASIX) 20 MG tablet Take 20 mg by mouth as needed.    . mupirocin ointment (BACTROBAN) 2 %  mupirocin 2 % topical ointment    . pantoprazole (PROTONIX) 40 MG tablet Take 40 mg by mouth daily as needed.    . ramipril (ALTACE) 5 MG capsule Take 2.5 mg by mouth daily.    . vitamin B-12 (CYANOCOBALAMIN) 1000 MCG tablet Take 1,000 mcg by mouth daily.     No current facility-administered medications for this visit.    OBJECTIVE: Vitals:   04/21/19 1034  BP: 132/60  Pulse: 70  Resp: 17  Temp: (!) 97.2 F (36.2 C)  SpO2: 98%     Body mass index is 28.19 kg/m.    ECOG FS:0 - Asymptomatic  General: Well-developed, well-nourished, no  acute distress. Eyes: Pink conjunctiva, anicteric sclera. HEENT: Normocephalic, moist mucous membranes. Lungs: No audible wheezing or coughing. Heart: Regular rate and rhythm. Abdomen: Soft, nontender, no obvious distention. Musculoskeletal: No edema, cyanosis, or clubbing. Neuro: Alert, answering all questions appropriately. Cranial nerves grossly intact. Skin: No rashes or petechiae noted. Psych: Normal affect.  LAB RESULTS:  Lab Results  Component Value Date   NA 139 06/14/2013   K 4.1 06/14/2013   CL 108 (H) 06/14/2013   CO2 28 06/14/2013   GLUCOSE 99 06/14/2013   BUN 26 (H) 06/14/2013   CREATININE 1.07 06/14/2013   CALCIUM 8.6 06/14/2013   PROT 7.4 05/27/2011   ALBUMIN 4.1 05/27/2011   AST 21 05/27/2011   ALT 22 05/27/2011   ALKPHOS 67 05/27/2011   BILITOT 0.9 05/27/2011   GFRNONAA 46 (L) 06/14/2013   GFRAA 53 (L) 06/14/2013    Lab Results  Component Value Date   WBC 107.2 (HH) 04/21/2019   NEUTROABS 1.3 (L) 04/21/2019   HGB 7.6 (L) 04/21/2019   HCT 25.2 (L) 04/21/2019   MCV 113.0 (H) 04/21/2019   PLT 91 (L) 04/21/2019     STUDIES: No results found.  ASSESSMENT: CLL, Rai stage 0  PLAN:   1. CLL: Confirmed by peripheral blood flow cytometry.  Patient's white blood cell count continues to trend up and is now 107.2.  Her hemoglobin is trending down and is now 7.6.  Platelets are stable at 91.  After lengthy discussion with the patient, she is agreed to initiate treatment with single agent Rituxan weekly x4.  Return to clinic on May 04, 2019 to initiate cycle 1 of 4.     2.  Thrombocytopenia: Chronic and unchanged.  Patient platelet count is 91 today. 3.  Anemia: Hemoglobin continues to trend down and is now 7.6. 4.  Macrocytosis: Previously B12 and folate were within normal limits.   5.  Heartburn: Recommended patient take her Protonix regularly rather than as needed  I spent a total of 30 minutes reviewing chart data, face-to-face evaluation with the  patient, counseling and coordination of care as detailed above.   Patient expressed understanding and was in agreement with this plan. She also understands that She can call clinic at any time with any questions, concerns, or complaints.    Lloyd Huger, MD   04/21/2019 12:35 PM    As needed.

## 2019-04-21 ENCOUNTER — Inpatient Hospital Stay: Payer: Medicare Other | Attending: Oncology | Admitting: Oncology

## 2019-04-21 ENCOUNTER — Inpatient Hospital Stay: Payer: Medicare Other

## 2019-04-21 ENCOUNTER — Encounter: Payer: Self-pay | Admitting: Oncology

## 2019-04-21 ENCOUNTER — Other Ambulatory Visit: Payer: Self-pay

## 2019-04-21 VITALS — BP 132/60 | HR 70 | Temp 97.2°F | Resp 17 | Wt 149.2 lb

## 2019-04-21 DIAGNOSIS — D649 Anemia, unspecified: Secondary | ICD-10-CM | POA: Insufficient documentation

## 2019-04-21 DIAGNOSIS — R5382 Chronic fatigue, unspecified: Secondary | ICD-10-CM | POA: Diagnosis not present

## 2019-04-21 DIAGNOSIS — D696 Thrombocytopenia, unspecified: Secondary | ICD-10-CM | POA: Diagnosis not present

## 2019-04-21 DIAGNOSIS — Z87891 Personal history of nicotine dependence: Secondary | ICD-10-CM | POA: Diagnosis not present

## 2019-04-21 DIAGNOSIS — J449 Chronic obstructive pulmonary disease, unspecified: Secondary | ICD-10-CM | POA: Diagnosis not present

## 2019-04-21 DIAGNOSIS — Z7189 Other specified counseling: Secondary | ICD-10-CM | POA: Diagnosis not present

## 2019-04-21 DIAGNOSIS — Z85038 Personal history of other malignant neoplasm of large intestine: Secondary | ICD-10-CM | POA: Diagnosis not present

## 2019-04-21 DIAGNOSIS — I10 Essential (primary) hypertension: Secondary | ICD-10-CM | POA: Insufficient documentation

## 2019-04-21 DIAGNOSIS — M1990 Unspecified osteoarthritis, unspecified site: Secondary | ICD-10-CM | POA: Diagnosis not present

## 2019-04-21 DIAGNOSIS — C9110 Chronic lymphocytic leukemia of B-cell type not having achieved remission: Secondary | ICD-10-CM

## 2019-04-21 DIAGNOSIS — Z79899 Other long term (current) drug therapy: Secondary | ICD-10-CM | POA: Insufficient documentation

## 2019-04-21 DIAGNOSIS — R12 Heartburn: Secondary | ICD-10-CM | POA: Insufficient documentation

## 2019-04-21 DIAGNOSIS — M810 Age-related osteoporosis without current pathological fracture: Secondary | ICD-10-CM | POA: Diagnosis not present

## 2019-04-21 DIAGNOSIS — R531 Weakness: Secondary | ICD-10-CM | POA: Diagnosis not present

## 2019-04-21 DIAGNOSIS — R5381 Other malaise: Secondary | ICD-10-CM | POA: Insufficient documentation

## 2019-04-21 LAB — CBC WITH DIFFERENTIAL/PLATELET
Abs Immature Granulocytes: 0.07 10*3/uL (ref 0.00–0.07)
Basophils Absolute: 0 10*3/uL (ref 0.0–0.1)
Basophils Relative: 0 %
Eosinophils Absolute: 0.1 10*3/uL (ref 0.0–0.5)
Eosinophils Relative: 0 %
HCT: 25.2 % — ABNORMAL LOW (ref 36.0–46.0)
Hemoglobin: 7.6 g/dL — ABNORMAL LOW (ref 12.0–15.0)
Immature Granulocytes: 0 %
Lymphocytes Relative: 98 %
Lymphs Abs: 104.9 10*3/uL — ABNORMAL HIGH (ref 0.7–4.0)
MCH: 34.1 pg — ABNORMAL HIGH (ref 26.0–34.0)
MCHC: 30.2 g/dL (ref 30.0–36.0)
MCV: 113 fL — ABNORMAL HIGH (ref 80.0–100.0)
Monocytes Absolute: 0.9 10*3/uL (ref 0.1–1.0)
Monocytes Relative: 1 %
Neutro Abs: 1.3 10*3/uL — ABNORMAL LOW (ref 1.7–7.7)
Neutrophils Relative %: 1 %
Platelets: 91 10*3/uL — ABNORMAL LOW (ref 150–400)
RBC: 2.23 MIL/uL — ABNORMAL LOW (ref 3.87–5.11)
RDW: 15.4 % (ref 11.5–15.5)
WBC: 107.2 10*3/uL (ref 4.0–10.5)
nRBC: 0 % (ref 0.0–0.2)

## 2019-04-21 NOTE — Progress Notes (Signed)
Pt reports lots of indigestion, only taking protonix intermittently

## 2019-04-21 NOTE — Progress Notes (Signed)
START OFF PATHWAY REGIMEN - Lymphoma and CLL   [id number]:Rituximab (IV/Subcut) D1 Weekly:   A cycle is every 7 days:     Rituximab-xxxx      Rituximab and hyaluronidase human   **Always confirm dose/schedule in your pharmacy ordering system**  Patient Characteristics: Chronic Lymphocytic Leukemia (CLL), First Line, Treatment Indicated, 17p del(-)/Unknown and ATM Mutation Negative/Unknown and TP53 Mutation Negative/Unknown, Age ? 49 or Frail (Any Age) Disease Type: Chronic Lymphocytic Leukemia (CLL) Disease Type: Not Applicable Disease Type: Not Applicable Line of Therapy: First Line Rai Stage: IV Treatment Indicated<= Treatment Indicated ATM Mutation Status: Unknown 17p Deletion Status: Unknown TP53 Mutation Status: Unknown Patient Age: ? 59 Patient Condition: Fit Patient Intent of Therapy: Non-Curative / Palliative Intent, Discussed with Patient

## 2019-04-24 NOTE — Progress Notes (Signed)
Pharmacist Chemotherapy Monitoring - Initial Assessment    Anticipated start date: 05/04/19   Regimen:  . Are orders appropriate based on the patient's diagnosis, regimen, and cycle? Yes . Does the plan date match the patient's scheduled date? Yes . Is the sequencing of drugs appropriate? Yes . Are the premedications appropriate for the patient's regimen? Yes . Prior Authorization for treatment is: Approved o If applicable, is the correct biosimilar selected based on the patient's insurance? yes  Organ Function and Labs: Marland Kitchen Are dose adjustments needed based on the patient's renal function, hepatic function, or hematologic function? No . Are appropriate labs ordered prior to the start of patient's treatment? Yes . Other organ system assessment, if indicated: N/A . The following baseline labs, if indicated, have been ordered: rituximab: baseline Hepatitis B labs  Dose Assessment: . Are the drug doses appropriate? Yes . Are the following correct: o Drug concentrations Yes o IV fluid compatible with drug Yes o Administration routes Yes o Timing of therapy Yes . If applicable, does the patient have documented access for treatment and/or plans for port-a-cath placement? no . If applicable, have lifetime cumulative doses been properly documented and assessed? not applicable Lifetime Dose Tracking  No doses have been documented on this patient for the following tracked chemicals: Doxorubicin, Epirubicin, Idarubicin, Daunorubicin, Mitoxantrone, Bleomycin, Oxaliplatin, Carboplatin, Liposomal Doxorubicin  o   Toxicity Monitoring/Prevention: . The patient has the following take home antiemetics prescribed: N/A . The patient has the following take home medications prescribed: N/A . Medication allergies and previous infusion related reactions, if applicable, have been reviewed and addressed. No . The patient's current medication list has been assessed for drug-drug interactions with their  chemotherapy regimen. no significant drug-drug interactions were identified on review.  Order Review: . Are the treatment plan orders signed? Yes . Is the patient scheduled to see a provider prior to their treatment? Yes  I verify that I have reviewed each item in the above checklist and answered each question accordingly.  Joshawa Dubin K 04/24/2019 8:43 AM

## 2019-04-30 NOTE — Progress Notes (Signed)
[place]  Telephone:(336) [phone] [phone]) [phone]  ID: Stacy Holloway OB: [birth year]  MR#: [id number]  [id number]  Patient Care Team: Idelle Crouch, MD as PCP - General (Internal Medicine) Lloyd Huger, MD as Consulting Physician (Hematology and Oncology)  CHIEF COMPLAINT: CLL  INTERVAL HISTORY: Patient returns to clinic today for further evaluation and consideration of cycle 1 of weekly Rituxan.  She is accompanied by her daughter today.  She continues to have chronic weakness and fatigue, but otherwise feels well.  She remains active and lives alone.  She has no neurologic complaints.  She denies any fevers or night sweats.  She has a good appetite and denies weight loss.  She denies any chest pain, shortness of breath, cough, or hemoptysis.  She denies any nausea, vomiting, constipation, or diarrhea. She has no urinary complaints.  Patient offers no further specific complaints today.  REVIEW OF SYSTEMS:   Review of Systems  Constitutional: Positive for malaise/fatigue. Negative for fever and weight loss.  Respiratory: Negative.  Negative for cough and shortness of breath.   Cardiovascular: Negative.  Negative for chest pain and leg swelling.  Gastrointestinal: Negative.  Negative for abdominal pain, constipation and heartburn.  Genitourinary: Negative.  Negative for dysuria.  Musculoskeletal: Negative.  Negative for myalgias.  Skin: Negative.  Negative for rash.  Neurological: Positive for weakness. Negative for focal weakness and headaches.  Psychiatric/Behavioral: Negative.  The patient is not nervous/anxious.     As per HPI. Otherwise, a complete review of systems is negative.  PAST MEDICAL HISTORY: Past Medical History:  Diagnosis Date  . Arthritis   . Cancer [facility])    colon cancer 2009  . Cataract   . COPD (chronic obstructive pulmonary disease) ([place])   . Heart disease   . Hemorrhoids   . Hypertension   . Indigestion   .  Insomnia   . Kidney stone   . Osteoporosis     PAST SURGICAL HISTORY: Past Surgical History:  Procedure Laterality Date  . ABDOMINAL HYSTERECTOMY    . APPENDECTOMY    . BREAST BIOPSY Left    negative 1982  . CHOLECYSTECTOMY    . colon cancer    . COLON RESECTION    . COLONOSCOPY  2008, 2010  . TONSILLECTOMY      FAMILY HISTORY: Family History  Problem Relation Age of Onset  . Breast cancer Neg Hx     ADVANCED DIRECTIVES (Y/N):  N  HEALTH MAINTENANCE: Social History   Tobacco Use  . Smoking status: Former Research scientist (life sciences)  . Smokeless tobacco: Never Used  Substance Use Topics  . Alcohol use: No  . Drug use: No     Colonoscopy:  PAP:  Bone density:  Lipid panel:  Allergies  Allergen Reactions  . Latex Shortness Of Breath and Other (See Comments)  . Other Shortness Of Breath and Other (See Comments)  . Sulfa Antibiotics Anaphylaxis    Current Outpatient Medications  Medication Sig Dispense Refill  . amLODipine (NORVASC) 2.5 MG tablet Take 2.5 mg by mouth daily.    . carvedilol (COREG) 6.25 MG tablet Take 6.25 mg by mouth 2 (two) times daily with a meal.    . Cholecalciferol (VITAMIN D3) 400 units CAPS Take 2 capsules by mouth daily.    Marland Kitchen docusate sodium (COLACE) 100 MG capsule Take 100 mg by mouth daily as needed for mild constipation.    . furosemide (LASIX) 20 MG tablet Take 20 mg by mouth as needed.    Marland Kitchen  mupirocin ointment (BACTROBAN) 2 % mupirocin 2 % topical ointment    . pantoprazole (PROTONIX) 40 MG tablet Take 40 mg by mouth daily as needed.    . ramipril (ALTACE) 5 MG capsule Take 2.5 mg by mouth daily.    . vitamin B-12 (CYANOCOBALAMIN) 1000 MCG tablet Take 1,000 mcg by mouth daily.     No current facility-administered medications for this visit.   Facility-Administered Medications Ordered in Other Visits  Medication Dose Route Frequency Provider Last Rate Last Admin  . 0.9 %  sodium chloride infusion   Intravenous Once PRN Lloyd Huger, MD    Stopped at 05/04/19 1200    OBJECTIVE: Vitals:   05/04/19 0847  BP: (!) 107/45  Pulse: (!) 53  Resp: 18  Temp: (!) 96.6 F (35.9 C)  SpO2: 99%     Body mass index is 27.93 kg/m.    ECOG FS:0 - Asymptomatic  General: Well-developed, well-nourished, no acute distress. Eyes: Pink conjunctiva, anicteric sclera. HEENT: Normocephalic, moist mucous membranes. Lungs: No audible wheezing or coughing. Heart: Regular rate and rhythm. Abdomen: Soft, nontender, no obvious distention. Musculoskeletal: No edema, cyanosis, or clubbing. Neuro: Alert, answering all questions appropriately. Cranial nerves grossly intact. Skin: No rashes or petechiae noted. Psych: Normal affect.  LAB RESULTS:  Lab Results  Component Value Date   NA 139 06/14/2013   K 4.1 06/14/2013   CL 108 (H) 06/14/2013   CO2 28 06/14/2013   GLUCOSE 99 06/14/2013   BUN 26 (H) 06/14/2013   CREATININE 1.07 06/14/2013   CALCIUM 8.6 06/14/2013   PROT 7.4 05/27/2011   ALBUMIN 4.1 05/27/2011   AST 21 05/27/2011   ALT 22 05/27/2011   ALKPHOS 67 05/27/2011   BILITOT 0.9 05/27/2011   GFRNONAA 46 (L) 06/14/2013   GFRAA 53 (L) 06/14/2013    Lab Results  Component Value Date   WBC 102.7 (HH) 05/04/2019   NEUTROABS 3.1 05/04/2019   HGB 7.6 (L) 05/04/2019   HCT 26.1 (L) 05/04/2019   MCV 113.0 (H) 05/04/2019   PLT 102 (L) 05/04/2019     STUDIES: No results found.  ASSESSMENT: CLL, Rai stage 0  PLAN:   1. CLL: Confirmed by peripheral blood flow cytometry.  Patient's white blood cell count continues to remain elevated, but is chronic and unchanged at 102.7.  Hemoglobin and platelets are essentially unchanged.  Patient initiated cycle 1 of Rituxan, but had a mild reaction and refused to continue with treatment.  She has been instructed to return to clinic as previously scheduled in 1 week for further evaluation and discussion of treatment options.      2.  Thrombocytopenia: Chronic and unchanged.  Patient's platelet  count is 102 today. 3.  Anemia: Chronic and unchanged.  Patient's hemoglobin is 7.6. 4.  Macrocytosis: Previously B12 and folate were within normal limits.   5.  Heartburn: Continue Protonix as directed.  Patient expressed understanding and was in agreement with this plan. She also understands that She can call clinic at any time with any questions, concerns, or complaints.    Lloyd Huger, MD   05/04/2019 12:58 PM    As needed.

## 2019-05-04 ENCOUNTER — Inpatient Hospital Stay: Payer: Medicare Other | Attending: Oncology

## 2019-05-04 ENCOUNTER — Inpatient Hospital Stay: Payer: Medicare Other

## 2019-05-04 ENCOUNTER — Inpatient Hospital Stay (HOSPITAL_BASED_OUTPATIENT_CLINIC_OR_DEPARTMENT_OTHER): Payer: Medicare Other | Admitting: Oncology

## 2019-05-04 ENCOUNTER — Encounter: Payer: Self-pay | Admitting: Oncology

## 2019-05-04 ENCOUNTER — Other Ambulatory Visit: Payer: Self-pay

## 2019-05-04 VITALS — BP 107/56 | HR 52 | Resp 18

## 2019-05-04 VITALS — BP 107/45 | HR 53 | Temp 96.6°F | Resp 18 | Wt 147.8 lb

## 2019-05-04 DIAGNOSIS — J449 Chronic obstructive pulmonary disease, unspecified: Secondary | ICD-10-CM | POA: Diagnosis not present

## 2019-05-04 DIAGNOSIS — R12 Heartburn: Secondary | ICD-10-CM | POA: Diagnosis not present

## 2019-05-04 DIAGNOSIS — Z87891 Personal history of nicotine dependence: Secondary | ICD-10-CM | POA: Diagnosis not present

## 2019-05-04 DIAGNOSIS — R531 Weakness: Secondary | ICD-10-CM | POA: Diagnosis not present

## 2019-05-04 DIAGNOSIS — C9110 Chronic lymphocytic leukemia of B-cell type not having achieved remission: Secondary | ICD-10-CM

## 2019-05-04 DIAGNOSIS — R5381 Other malaise: Secondary | ICD-10-CM | POA: Diagnosis not present

## 2019-05-04 DIAGNOSIS — Z79899 Other long term (current) drug therapy: Secondary | ICD-10-CM | POA: Insufficient documentation

## 2019-05-04 DIAGNOSIS — I10 Essential (primary) hypertension: Secondary | ICD-10-CM | POA: Diagnosis not present

## 2019-05-04 DIAGNOSIS — M1990 Unspecified osteoarthritis, unspecified site: Secondary | ICD-10-CM | POA: Insufficient documentation

## 2019-05-04 DIAGNOSIS — D696 Thrombocytopenia, unspecified: Secondary | ICD-10-CM | POA: Diagnosis not present

## 2019-05-04 DIAGNOSIS — D649 Anemia, unspecified: Secondary | ICD-10-CM | POA: Diagnosis not present

## 2019-05-04 DIAGNOSIS — Z5112 Encounter for antineoplastic immunotherapy: Secondary | ICD-10-CM | POA: Insufficient documentation

## 2019-05-04 DIAGNOSIS — R5383 Other fatigue: Secondary | ICD-10-CM | POA: Insufficient documentation

## 2019-05-04 LAB — CBC WITH DIFFERENTIAL/PLATELET
Abs Immature Granulocytes: 0 10*3/uL (ref 0.00–0.07)
Basophils Absolute: 0 10*3/uL (ref 0.0–0.1)
Basophils Relative: 0 %
Eosinophils Absolute: 0 10*3/uL (ref 0.0–0.5)
Eosinophils Relative: 0 %
HCT: 26.1 % — ABNORMAL LOW (ref 36.0–46.0)
Hemoglobin: 7.6 g/dL — ABNORMAL LOW (ref 12.0–15.0)
Lymphocytes Relative: 96 %
Lymphs Abs: 98.6 10*3/uL — ABNORMAL HIGH (ref 0.7–4.0)
MCH: 32.9 pg (ref 26.0–34.0)
MCHC: 29.1 g/dL — ABNORMAL LOW (ref 30.0–36.0)
MCV: 113 fL — ABNORMAL HIGH (ref 80.0–100.0)
Monocytes Absolute: 1 10*3/uL (ref 0.1–1.0)
Monocytes Relative: 1 %
Neutro Abs: 3.1 10*3/uL (ref 1.7–7.7)
Neutrophils Relative %: 3 %
Platelets: 102 10*3/uL — ABNORMAL LOW (ref 150–400)
RBC: 2.31 MIL/uL — ABNORMAL LOW (ref 3.87–5.11)
RDW: 14.6 % (ref 11.5–15.5)
Smear Review: NORMAL
WBC Morphology: ABNORMAL
WBC: 102.7 10*3/uL (ref 4.0–10.5)
nRBC: 0 % (ref 0.0–0.2)

## 2019-05-04 LAB — HEPATITIS B SURFACE ANTIGEN: Hepatitis B Surface Ag: NONREACTIVE

## 2019-05-04 LAB — HEPATITIS B CORE ANTIBODY, TOTAL: Hep B Core Total Ab: NONREACTIVE

## 2019-05-04 MED ORDER — DIPHENHYDRAMINE HCL 50 MG/ML IJ SOLN
50.0000 mg | Freq: Once | INTRAMUSCULAR | Status: AC | PRN
  Administered 2019-05-04: 11:00:00 50 mg via INTRAVENOUS

## 2019-05-04 MED ORDER — SODIUM CHLORIDE 0.9 % IV SOLN
Freq: Once | INTRAVENOUS | Status: AC
  Filled 2019-05-04: qty 250

## 2019-05-04 MED ORDER — DIPHENHYDRAMINE HCL 25 MG PO CAPS
25.0000 mg | ORAL_CAPSULE | Freq: Once | ORAL | Status: AC
  Administered 2019-05-04: 10:00:00 25 mg via ORAL
  Filled 2019-05-04: qty 1

## 2019-05-04 MED ORDER — SODIUM CHLORIDE 0.9 % IV SOLN
375.0000 mg/m2 | Freq: Once | INTRAVENOUS | Status: AC
  Administered 2019-05-04: 11:00:00 600 mg via INTRAVENOUS
  Filled 2019-05-04: qty 50

## 2019-05-04 MED ORDER — ACETAMINOPHEN 325 MG PO TABS
650.0000 mg | ORAL_TABLET | Freq: Once | ORAL | Status: AC
  Administered 2019-05-04: 650 mg via ORAL
  Filled 2019-05-04: qty 2

## 2019-05-04 MED ORDER — FAMOTIDINE IN NACL 20-0.9 MG/50ML-% IV SOLN
20.0000 mg | Freq: Once | INTRAVENOUS | Status: AC | PRN
  Administered 2019-05-04: 20 mg via INTRAVENOUS

## 2019-05-04 MED ORDER — SODIUM CHLORIDE 0.9 % IV SOLN
Freq: Once | INTRAVENOUS | Status: DC | PRN
  Filled 2019-05-04: qty 250

## 2019-05-04 MED ORDER — METHYLPREDNISOLONE SODIUM SUCC 125 MG IJ SOLR
125.0000 mg | Freq: Once | INTRAMUSCULAR | Status: AC | PRN
  Administered 2019-05-04: 125 mg via INTRAVENOUS

## 2019-05-04 NOTE — Progress Notes (Signed)
Patient states her energy level has diminished and she feels a lot of fatigue. Has new treatment107

## 2019-05-04 NOTE — Progress Notes (Signed)
1108 Patient called RN over and stated that she was having trouble breathing, abd pain and felt hot all over.  Rituxan infusion stopped immediately, Beckey Rutter, NP notified.  IVF, Benadryl and Solu Medrol given per reaction policy.See flowsheet for VS.  1200 Pt is feeling back to normal except for some sleepiness from the Benadryl.  Daughter called and pt discharged to home.

## 2019-05-04 NOTE — Progress Notes (Signed)
Pharmacist Chemotherapy Monitoring - Follow Up Assessment    I verify that I have reviewed each item in the below checklist:  . Regimen for the patient is scheduled for the appropriate day and plan matches scheduled date. Marland Kitchen Appropriate non-routine labs are ordered dependent on drug ordered. . If applicable, additional medications reviewed and ordered per protocol based on lifetime cumulative doses and/or treatment regimen.   Plan for follow-up and/or issues identified: No . I-vent associated with next due treatment: No . MD and/or nursing notified: No  Kristopher Attwood K 05/04/2019 11:48 AM

## 2019-05-08 ENCOUNTER — Encounter: Payer: Self-pay | Admitting: Oncology

## 2019-05-08 ENCOUNTER — Other Ambulatory Visit: Payer: Self-pay

## 2019-05-08 NOTE — Progress Notes (Signed)
Pt called for prescreening for appt on Monday. Reports that she's been feeling jittery this week since having a reaction on Monday, but also because she found out that one of her children had surgery on Monday and she hadn't been aware of that previously. Pt otherwise reports feeling well.

## 2019-05-08 NOTE — Progress Notes (Signed)
[place]  Telephone:(336) [phone] Fax:(336) [phone]  ID: Stacy Holloway OB: March 21, 1921  MR#: [id number]  [id number]  Patient Care Team: Idelle Crouch, MD as PCP - General (Internal Medicine) Lloyd Huger, MD as Consulting Physician (Hematology and Oncology)  CHIEF COMPLAINT: CLL  INTERVAL HISTORY: Patient returns to clinic today for repeat laboratory work and further evaluation.  She had a mild reaction to the Rituxan last week and did not receive the full treatment.  She currently feels well and is back to her baseline.  She has chronic weakness and fatigue.  She remains active and lives alone.  She has no neurologic complaints.  She denies any fevers or night sweats.  She has a good appetite and denies weight loss.  She denies any chest pain, shortness of breath, cough, or hemoptysis.  She denies any nausea, vomiting, constipation, or diarrhea. She has no urinary complaints.  Patient offers no further specific complaints today.  REVIEW OF SYSTEMS:   Review of Systems  Constitutional: Positive for malaise/fatigue. Negative for fever and weight loss.  Respiratory: Negative.  Negative for cough and shortness of breath.   Cardiovascular: Negative.  Negative for chest pain and leg swelling.  Gastrointestinal: Negative.  Negative for abdominal pain, constipation and heartburn.  Genitourinary: Negative.  Negative for dysuria.  Musculoskeletal: Negative.  Negative for myalgias.  Skin: Negative.  Negative for rash.  Neurological: Positive for weakness. Negative for focal weakness and headaches.  Psychiatric/Behavioral: Negative.  The patient is not nervous/anxious.     As per HPI. Otherwise, a complete review of systems is negative.  PAST MEDICAL HISTORY: Past Medical History:  Diagnosis Date  . Arthritis   . Cancer [facility])    colon cancer 2009  . Cataract   . COPD (chronic obstructive pulmonary disease) ([place])   . Heart disease   . Hemorrhoids   .  Hypertension   . Indigestion   . Insomnia   . Kidney stone   . Osteoporosis     PAST SURGICAL HISTORY: Past Surgical History:  Procedure Laterality Date  . ABDOMINAL HYSTERECTOMY    . APPENDECTOMY    . BREAST BIOPSY Left    negative 1982  . CHOLECYSTECTOMY    . colon cancer    . COLON RESECTION    . COLONOSCOPY  2008, 2010  . TONSILLECTOMY      FAMILY HISTORY: Family History  Problem Relation Age of Onset  . Breast cancer Neg Hx     ADVANCED DIRECTIVES (Y/N):  N  HEALTH MAINTENANCE: Social History   Tobacco Use  . Smoking status: Former Research scientist (life sciences)  . Smokeless tobacco: Never Used  Substance Use Topics  . Alcohol use: No  . Drug use: No     Colonoscopy:  PAP:  Bone density:  Lipid panel:  Allergies  Allergen Reactions  . Latex Shortness Of Breath and Other (See Comments)  . Other Shortness Of Breath and Other (See Comments)  . Sulfa Antibiotics Anaphylaxis    Current Outpatient Medications  Medication Sig Dispense Refill  . amLODipine (NORVASC) 2.5 MG tablet Take 2.5 mg by mouth daily.    . carvedilol (COREG) 6.25 MG tablet Take 6.25 mg by mouth 2 (two) times daily with a meal.    . Cholecalciferol (VITAMIN D3) 400 units CAPS Take 2 capsules by mouth daily.    Marland Kitchen docusate sodium (COLACE) 100 MG capsule Take 100 mg by mouth daily as needed for mild constipation.    . furosemide (LASIX) 20  MG tablet Take 20 mg by mouth as needed.    . mupirocin ointment (BACTROBAN) 2 % mupirocin 2 % topical ointment    . pantoprazole (PROTONIX) 40 MG tablet Take 40 mg by mouth daily as needed.    . ramipril (ALTACE) 5 MG capsule Take 2.5 mg by mouth daily.    . vitamin B-12 (CYANOCOBALAMIN) 1000 MCG tablet Take 1,000 mcg by mouth daily.     No current facility-administered medications for this visit.    OBJECTIVE: Vitals:   05/11/19 1403  BP: (!) 117/54  Pulse: 69  Resp: 20  Temp: 97.6 F (36.4 C)  SpO2: 99%     Body mass index is 27.93 kg/m.    ECOG FS:0 -  Asymptomatic  General: Well-developed, well-nourished, no acute distress. Eyes: Pink conjunctiva, anicteric sclera. HEENT: Normocephalic, moist mucous membranes. Lungs: No audible wheezing or coughing. Heart: Regular rate and rhythm. Abdomen: Soft, nontender, no obvious distention. Musculoskeletal: No edema, cyanosis, or clubbing. Neuro: Alert, answering all questions appropriately. Cranial nerves grossly intact. Skin: No rashes or petechiae noted. Psych: Normal affect.  LAB RESULTS:  Lab Results  Component Value Date   NA 139 06/14/2013   K 4.1 06/14/2013   CL 108 (H) 06/14/2013   CO2 28 06/14/2013   GLUCOSE 99 06/14/2013   BUN 26 (H) 06/14/2013   CREATININE 1.07 06/14/2013   CALCIUM 8.6 06/14/2013   PROT 7.4 05/27/2011   ALBUMIN 4.1 05/27/2011   AST 21 05/27/2011   ALT 22 05/27/2011   ALKPHOS 67 05/27/2011   BILITOT 0.9 05/27/2011   GFRNONAA 46 (L) 06/14/2013   GFRAA 53 (L) 06/14/2013    Lab Results  Component Value Date   WBC 101.8 (HH) 05/11/2019   NEUTROABS 1.2 (L) 05/11/2019   HGB 8.0 (L) 05/11/2019   HCT 26.0 (L) 05/11/2019   MCV 113.0 (H) 05/11/2019   PLT 104 (L) 05/11/2019     STUDIES: No results found.  ASSESSMENT: CLL, Rai stage 0  PLAN:   1. CLL: Confirmed by peripheral blood flow cytometry.  Patient received minimal Rituxan last week prior to having a mild reaction and then treatment was discontinued.  Her white blood cell count remains unchanged at 101.8.  She does not wish to retry any treatments at this time, but stated she would consider Imbruvica in the future if needed.  No intervention is needed at this time.  Return to clinic in 6 weeks with repeat laboratory work and further evaluation.  2.  Thrombocytopenia: Chronic and unchanged.  Patient's platelet count is 104 today. 3.  Anemia: Chronic and unchanged.  Patient's hemoglobin has trended up slightly to 8.0. 4.  Macrocytosis: Previously B12 and folate were within normal limits.   5.   Heartburn: Continue Protonix as directed.  Patient expressed understanding and was in agreement with this plan. She also understands that She can call clinic at any time with any questions, concerns, or complaints.    Lloyd Huger, MD   05/11/2019 2:47 PM    As needed.

## 2019-05-11 ENCOUNTER — Inpatient Hospital Stay: Payer: Medicare Other

## 2019-05-11 ENCOUNTER — Other Ambulatory Visit: Payer: Self-pay

## 2019-05-11 ENCOUNTER — Encounter: Payer: Self-pay | Admitting: Oncology

## 2019-05-11 ENCOUNTER — Inpatient Hospital Stay: Payer: Medicare Other | Admitting: Oncology

## 2019-05-11 ENCOUNTER — Inpatient Hospital Stay (HOSPITAL_BASED_OUTPATIENT_CLINIC_OR_DEPARTMENT_OTHER): Payer: Medicare Other | Admitting: Oncology

## 2019-05-11 VITALS — BP 117/54 | HR 69 | Temp 97.6°F | Resp 20 | Wt 147.8 lb

## 2019-05-11 DIAGNOSIS — C9110 Chronic lymphocytic leukemia of B-cell type not having achieved remission: Secondary | ICD-10-CM

## 2019-05-11 LAB — CBC WITH DIFFERENTIAL/PLATELET
Abs Immature Granulocytes: 0.07 10*3/uL (ref 0.00–0.07)
Basophils Absolute: 0.1 10*3/uL (ref 0.0–0.1)
Basophils Relative: 0 %
Eosinophils Absolute: 0.1 10*3/uL (ref 0.0–0.5)
Eosinophils Relative: 0 %
HCT: 26 % — ABNORMAL LOW (ref 36.0–46.0)
Hemoglobin: 8 g/dL — ABNORMAL LOW (ref 12.0–15.0)
Immature Granulocytes: 0 %
Lymphocytes Relative: 97 %
Lymphs Abs: 98.6 10*3/uL — ABNORMAL HIGH (ref 0.7–4.0)
MCH: 34.8 pg — ABNORMAL HIGH (ref 26.0–34.0)
MCHC: 30.8 g/dL (ref 30.0–36.0)
MCV: 113 fL — ABNORMAL HIGH (ref 80.0–100.0)
Monocytes Absolute: 1.7 10*3/uL — ABNORMAL HIGH (ref 0.1–1.0)
Monocytes Relative: 2 %
Neutro Abs: 1.2 10*3/uL — ABNORMAL LOW (ref 1.7–7.7)
Neutrophils Relative %: 1 %
Platelets: 104 10*3/uL — ABNORMAL LOW (ref 150–400)
RBC: 2.3 MIL/uL — ABNORMAL LOW (ref 3.87–5.11)
RDW: 15 % (ref 11.5–15.5)
WBC: 101.8 10*3/uL (ref 4.0–10.5)
nRBC: 0 % (ref 0.0–0.2)

## 2019-06-19 ENCOUNTER — Other Ambulatory Visit: Payer: Self-pay

## 2019-06-19 ENCOUNTER — Encounter: Payer: Self-pay | Admitting: Oncology

## 2019-06-19 NOTE — Progress Notes (Signed)
Patient prescreened for appointment. Patient has no concerns or questions.  

## 2019-06-20 NOTE — Progress Notes (Signed)
[place]  [phone]) [phone] Fax:(336) [phone]  ID: LEELLA YANNI OB: 10/17/1921  MR#: [id number]  [id number]  Patient Care Team: Idelle Crouch, MD as PCP - General (Internal Medicine) Lloyd Huger, MD as Consulting Physician (Hematology and Oncology)  CHIEF COMPLAINT: CLL  INTERVAL HISTORY: Patient returns to clinic today for repeat laboratory work and further evaluation.  She has chronic weakness and fatigue, but otherwise feels well.  She remains active and lives alone.  She has no neurologic complaints.  She denies any fevers or night sweats.  She has a good appetite and denies weight loss.  She denies any chest pain, shortness of breath, cough, or hemoptysis.  She denies any nausea, vomiting, constipation, or diarrhea. She has no urinary complaints.  Patient offers no further specific complaints today.  REVIEW OF SYSTEMS:   Review of Systems  Constitutional: Positive for malaise/fatigue. Negative for fever and weight loss.  Respiratory: Negative.  Negative for cough and shortness of breath.   Cardiovascular: Negative.  Negative for chest pain and leg swelling.  Gastrointestinal: Negative.  Negative for abdominal pain, constipation and heartburn.  Genitourinary: Negative.  Negative for dysuria.  Musculoskeletal: Negative.  Negative for myalgias.  Skin: Negative.  Negative for rash.  Neurological: Positive for weakness. Negative for focal weakness and headaches.  Psychiatric/Behavioral: Negative.  The patient is not nervous/anxious.     As per HPI. Otherwise, a complete review of systems is negative.  PAST MEDICAL HISTORY: Past Medical History:  Diagnosis Date  . Arthritis   . Cancer [facility])    colon cancer 2009  . Cataract   . COPD (chronic obstructive pulmonary disease) ([place])   . Heart disease   . Hemorrhoids   . Hypertension   . Indigestion   . Insomnia   . Kidney stone   . Osteoporosis     PAST SURGICAL HISTORY: Past  Surgical History:  Procedure Laterality Date  . ABDOMINAL HYSTERECTOMY    . APPENDECTOMY    . BREAST BIOPSY Left    negative 1982  . CHOLECYSTECTOMY    . colon cancer    . COLON RESECTION    . COLONOSCOPY  2008, 2010  . TONSILLECTOMY      FAMILY HISTORY: Family History  Problem Relation Age of Onset  . Breast cancer Neg Hx     ADVANCED DIRECTIVES (Y/N):  N  HEALTH MAINTENANCE: Social History   Tobacco Use  . Smoking status: Former Research scientist (life sciences)  . Smokeless tobacco: Never Used  Substance Use Topics  . Alcohol use: No  . Drug use: No     Colonoscopy:  PAP:  Bone density:  Lipid panel:  Allergies  Allergen Reactions  . Latex Shortness Of Breath and Other (See Comments)  . Other Shortness Of Breath and Other (See Comments)  . Sulfa Antibiotics Anaphylaxis    Current Outpatient Medications  Medication Sig Dispense Refill  . amLODipine (NORVASC) 2.5 MG tablet Take 2.5 mg by mouth daily.    . carvedilol (COREG) 6.25 MG tablet Take 6.25 mg by mouth 2 (two) times daily with a meal.    . Cholecalciferol (VITAMIN D3) 400 units CAPS Take 2 capsules by mouth daily.    Marland Kitchen docusate sodium (COLACE) 100 MG capsule Take 100 mg by mouth daily as needed for mild constipation.    . furosemide (LASIX) 20 MG tablet Take 20 mg by mouth as needed.    . mupirocin ointment (BACTROBAN) 2 % mupirocin 2 % topical ointment    .  pantoprazole (PROTONIX) 40 MG tablet Take 40 mg by mouth daily as needed.    . ramipril (ALTACE) 5 MG capsule Take 2.5 mg by mouth daily.    . vitamin B-12 (CYANOCOBALAMIN) 1000 MCG tablet Take 1,000 mcg by mouth daily.     No current facility-administered medications for this visit.    OBJECTIVE: There were no vitals filed for this visit.   There is no height or weight on file to calculate BMI.    ECOG FS:0 - Asymptomatic  General: Well-developed, well-nourished, no acute distress. Eyes: Pink conjunctiva, anicteric sclera. HEENT: Normocephalic, moist mucous  membranes. Lungs: No audible wheezing or coughing. Heart: Regular rate and rhythm. Abdomen: Soft, nontender, no obvious distention. Musculoskeletal: No edema, cyanosis, or clubbing. Neuro: Alert, answering all questions appropriately. Cranial nerves grossly intact. Skin: No rashes or petechiae noted. Psych: Normal affect.  LAB RESULTS:  Lab Results  Component Value Date   NA 139 06/14/2013   K 4.1 06/14/2013   CL 108 (H) 06/14/2013   CO2 28 06/14/2013   GLUCOSE 99 06/14/2013   BUN 26 (H) 06/14/2013   CREATININE 1.07 06/14/2013   CALCIUM 8.6 06/14/2013   PROT 7.4 05/27/2011   ALBUMIN 4.1 05/27/2011   AST 21 05/27/2011   ALT 22 05/27/2011   ALKPHOS 67 05/27/2011   BILITOT 0.9 05/27/2011   GFRNONAA 46 (L) 06/14/2013   GFRAA 53 (L) 06/14/2013    Lab Results  Component Value Date   WBC 76.6 (HH) 06/22/2019   NEUTROABS 1.3 (L) 06/22/2019   HGB 7.7 (L) 06/22/2019   HCT 24.4 (L) 06/22/2019   MCV 111.9 (H) 06/22/2019   PLT 93 (L) 06/22/2019     STUDIES: No results found.  ASSESSMENT: CLL, Rai stage 0  PLAN:   1. CLL: Confirmed by peripheral blood flow cytometry.  Patient received minimal Rituxan prior to having a mild reaction and then treatment was discontinued.  Her white count has decreased slightly to 76.6. She does not wish to retry any treatments at this time, but stated she would consider Imbruvica in the future if needed.  No intervention is needed at this time.  Return to clinic in 6 weeks for repeat laboratory work only and then in 3 months for repeat laboratory work and further evaluation.   2.  Thrombocytopenia: Chronic and unchanged.  Patient's platelet count is 93 today. 3.  Anemia: Chronic and unchanged.  Hemoglobin 7.7. 4.  Macrocytosis: Previously B12 and folate were within normal limits.   5.  Heartburn: Continue Protonix as directed.  Patient expressed understanding and was in agreement with this plan. She also understands that She can call clinic at  any time with any questions, concerns, or complaints.    Lloyd Huger, MD   06/23/2019 6:24 AM    As needed.

## 2019-06-22 ENCOUNTER — Inpatient Hospital Stay: Payer: Medicare Other | Attending: Oncology

## 2019-06-22 ENCOUNTER — Other Ambulatory Visit: Payer: Self-pay

## 2019-06-22 ENCOUNTER — Inpatient Hospital Stay (HOSPITAL_BASED_OUTPATIENT_CLINIC_OR_DEPARTMENT_OTHER): Payer: Medicare Other | Admitting: Oncology

## 2019-06-22 DIAGNOSIS — C9110 Chronic lymphocytic leukemia of B-cell type not having achieved remission: Secondary | ICD-10-CM | POA: Insufficient documentation

## 2019-06-22 DIAGNOSIS — D696 Thrombocytopenia, unspecified: Secondary | ICD-10-CM | POA: Diagnosis not present

## 2019-06-22 DIAGNOSIS — M810 Age-related osteoporosis without current pathological fracture: Secondary | ICD-10-CM | POA: Insufficient documentation

## 2019-06-22 DIAGNOSIS — Z87891 Personal history of nicotine dependence: Secondary | ICD-10-CM | POA: Diagnosis not present

## 2019-06-22 DIAGNOSIS — R12 Heartburn: Secondary | ICD-10-CM | POA: Diagnosis not present

## 2019-06-22 DIAGNOSIS — R531 Weakness: Secondary | ICD-10-CM | POA: Insufficient documentation

## 2019-06-22 DIAGNOSIS — Z79899 Other long term (current) drug therapy: Secondary | ICD-10-CM | POA: Insufficient documentation

## 2019-06-22 DIAGNOSIS — R5381 Other malaise: Secondary | ICD-10-CM | POA: Diagnosis not present

## 2019-06-22 DIAGNOSIS — J449 Chronic obstructive pulmonary disease, unspecified: Secondary | ICD-10-CM | POA: Diagnosis not present

## 2019-06-22 DIAGNOSIS — M1990 Unspecified osteoarthritis, unspecified site: Secondary | ICD-10-CM | POA: Insufficient documentation

## 2019-06-22 DIAGNOSIS — Z85038 Personal history of other malignant neoplasm of large intestine: Secondary | ICD-10-CM | POA: Insufficient documentation

## 2019-06-22 DIAGNOSIS — D649 Anemia, unspecified: Secondary | ICD-10-CM | POA: Diagnosis not present

## 2019-06-22 DIAGNOSIS — I10 Essential (primary) hypertension: Secondary | ICD-10-CM | POA: Diagnosis not present

## 2019-06-22 DIAGNOSIS — R5382 Chronic fatigue, unspecified: Secondary | ICD-10-CM | POA: Diagnosis not present

## 2019-06-22 LAB — CBC WITH DIFFERENTIAL/PLATELET
Abs Immature Granulocytes: 0.03 10*3/uL (ref 0.00–0.07)
Basophils Absolute: 0 10*3/uL (ref 0.0–0.1)
Basophils Relative: 0 %
Eosinophils Absolute: 0.1 10*3/uL (ref 0.0–0.5)
Eosinophils Relative: 0 %
HCT: 24.4 % — ABNORMAL LOW (ref 36.0–46.0)
Hemoglobin: 7.7 g/dL — ABNORMAL LOW (ref 12.0–15.0)
Immature Granulocytes: 0 %
Lymphocytes Relative: 96 %
Lymphs Abs: 73.9 10*3/uL — ABNORMAL HIGH (ref 0.7–4.0)
MCH: 35.3 pg — ABNORMAL HIGH (ref 26.0–34.0)
MCHC: 31.6 g/dL (ref 30.0–36.0)
MCV: 111.9 fL — ABNORMAL HIGH (ref 80.0–100.0)
Monocytes Absolute: 1.2 10*3/uL — ABNORMAL HIGH (ref 0.1–1.0)
Monocytes Relative: 2 %
Neutro Abs: 1.3 10*3/uL — ABNORMAL LOW (ref 1.7–7.7)
Neutrophils Relative %: 2 %
Platelets: 93 10*3/uL — ABNORMAL LOW (ref 150–400)
RBC: 2.18 MIL/uL — ABNORMAL LOW (ref 3.87–5.11)
RDW: 14.5 % (ref 11.5–15.5)
WBC: 76.6 10*3/uL (ref 4.0–10.5)
nRBC: 0 % (ref 0.0–0.2)

## 2019-08-06 ENCOUNTER — Other Ambulatory Visit: Payer: Self-pay

## 2019-08-06 ENCOUNTER — Inpatient Hospital Stay: Payer: Medicare Other | Attending: Oncology

## 2019-08-06 DIAGNOSIS — C9110 Chronic lymphocytic leukemia of B-cell type not having achieved remission: Secondary | ICD-10-CM | POA: Diagnosis present

## 2019-08-06 LAB — IRON AND TIBC
Iron: 60 ug/dL (ref 28–170)
Saturation Ratios: 20 % (ref 10.4–31.8)
TIBC: 300 ug/dL (ref 250–450)
UIBC: 240 ug/dL

## 2019-08-06 LAB — CBC WITH DIFFERENTIAL/PLATELET
Abs Immature Granulocytes: 0.05 10*3/uL (ref 0.00–0.07)
Basophils Absolute: 0 10*3/uL (ref 0.0–0.1)
Basophils Relative: 0 %
Eosinophils Absolute: 0.1 10*3/uL (ref 0.0–0.5)
Eosinophils Relative: 0 %
HCT: 23.4 % — ABNORMAL LOW (ref 36.0–46.0)
Hemoglobin: 7.5 g/dL — ABNORMAL LOW (ref 12.0–15.0)
Immature Granulocytes: 0 %
Lymphocytes Relative: 98 %
Lymphs Abs: 85.1 10*3/uL — ABNORMAL HIGH (ref 0.7–4.0)
MCH: 34.9 pg — ABNORMAL HIGH (ref 26.0–34.0)
MCHC: 32.1 g/dL (ref 30.0–36.0)
MCV: 108.8 fL — ABNORMAL HIGH (ref 80.0–100.0)
Monocytes Absolute: 0.8 10*3/uL (ref 0.1–1.0)
Monocytes Relative: 1 %
Neutro Abs: 1.3 10*3/uL — ABNORMAL LOW (ref 1.7–7.7)
Neutrophils Relative %: 1 %
Platelets: 94 10*3/uL — ABNORMAL LOW (ref 150–400)
RBC: 2.15 MIL/uL — ABNORMAL LOW (ref 3.87–5.11)
RDW: 14.5 % (ref 11.5–15.5)
Smear Review: NORMAL
WBC Morphology: ABNORMAL
WBC: 87.3 10*3/uL (ref 4.0–10.5)
nRBC: 0 % (ref 0.0–0.2)

## 2019-08-06 LAB — FERRITIN: Ferritin: 34 ng/mL (ref 11–307)

## 2019-09-17 NOTE — Progress Notes (Signed)
[place]  Telephone:(336) [phone] Fax:(336) [phone]  ID: CEOLA PARA OB: November 06, 1921  MR#: [id number]  IWO#:[id number]  Patient Care Team: Idelle Crouch, MD as PCP - General (Internal Medicine) Lloyd Huger, MD as Consulting Physician (Hematology and Oncology)  I connected with Stacy Holloway on 09/22/19 at  2:30 PM EDT by video enabled telemedicine visit and verified that I am speaking with the correct person using two identifiers.   I discussed the limitations, risks, security and privacy concerns of performing an evaluation and management service by telemedicine and the availability of in-person appointments. I also discussed with the patient that there may be a patient responsible charge related to this service. The patient expressed understanding and agreed to proceed.   Other persons participating in the visit and their role in the encounter: Patient, MD.  Patient's location: Home. Provider's location: Clinic.  CHIEF COMPLAINT: CLL  INTERVAL HISTORY: Patient agreed to video assisted telemedicine visit for further evaluation and discussion of her laboratory results.  She continues to have a chronic weakness and fatigue, but otherwise feels well.  She remains active and lives alone, although reports that she is moving to [place] soon to be closer to family.  She has no neurologic complaints.  She denies any fevers or night sweats.  She has a good appetite and denies weight loss.  She denies any chest pain, shortness of breath, cough, or hemoptysis.  She denies any nausea, vomiting, constipation, or diarrhea. She has no urinary complaints.  Patient offers no further specific complaints today.  REVIEW OF SYSTEMS:   Review of Systems  Constitutional: Positive for malaise/fatigue. Negative for fever and weight loss.  Respiratory: Negative.  Negative for cough and shortness of breath.   Cardiovascular: Negative.  Negative for chest pain and leg  swelling.  Gastrointestinal: Negative.  Negative for abdominal pain, constipation and heartburn.  Genitourinary: Negative.  Negative for dysuria.  Musculoskeletal: Negative.  Negative for myalgias.  Skin: Negative.  Negative for rash.  Neurological: Positive for weakness. Negative for focal weakness and headaches.  Psychiatric/Behavioral: Negative.  The patient is not nervous/anxious.     As per HPI. Otherwise, a complete review of systems is negative.  PAST MEDICAL HISTORY: Past Medical History:  Diagnosis Date  . Arthritis   . Cancer [facility])    colon cancer 2009  . Cataract   . COPD (chronic obstructive pulmonary disease) ([place])   . Heart disease   . Hemorrhoids   . Hypertension   . Indigestion   . Insomnia   . Kidney stone   . Osteoporosis     PAST SURGICAL HISTORY: Past Surgical History:  Procedure Laterality Date  . ABDOMINAL HYSTERECTOMY    . APPENDECTOMY    . BREAST BIOPSY Left    negative 1982  . CHOLECYSTECTOMY    . colon cancer    . COLON RESECTION    . COLONOSCOPY  2008, 2010  . TONSILLECTOMY      FAMILY HISTORY: Family History  Problem Relation Age of Onset  . Breast cancer Neg Hx     ADVANCED DIRECTIVES (Y/N):  N  HEALTH MAINTENANCE: Social History   Tobacco Use  . Smoking status: Former Research scientist (life sciences)  . Smokeless tobacco: Never Used  Vaping Use  . Vaping Use: Never used  Substance Use Topics  . Alcohol use: No  . Drug use: No     Colonoscopy:  PAP:  Bone density:  Lipid panel:  Allergies  Allergen Reactions  .  Latex Shortness Of Breath and Other (See Comments)  . Other Shortness Of Breath and Other (See Comments)    Dental work on teeth- there was something on gloves   . Rituxan [Rituximab] Shortness Of Breath    And itching, nasueated  . Sulfa Antibiotics Anaphylaxis    Current Outpatient Medications  Medication Sig Dispense Refill  . carvedilol (COREG) 6.25 MG tablet Take 6.25 mg by mouth 2 (two) times daily with a meal.    .  Cholecalciferol (VITAMIN D3) 25 MCG (1000 UT) CAPS Take 1 capsule by mouth daily.    Marland Kitchen docusate sodium (COLACE) 100 MG capsule Take 100 mg by mouth daily as needed for mild constipation.    . furosemide (LASIX) 20 MG tablet Take 20 mg by mouth as needed.    . pantoprazole (PROTONIX) 40 MG tablet Take 40 mg by mouth daily as needed.    . ramipril (ALTACE) 5 MG capsule Take 2.5 mg by mouth daily.    . vitamin B-12 (CYANOCOBALAMIN) 1000 MCG tablet Take 1,000 mcg by mouth daily.     No current facility-administered medications for this visit.    OBJECTIVE: Vitals:   09/21/19 1503  Temp: 98.1 F (36.7 C)     There is no height or weight on file to calculate BMI.    ECOG FS:0 - Asymptomatic  General: Well-developed, well-nourished, no acute distress. HEENT: Normocephalic. Neuro: Alert, answering all questions appropriately. Cranial nerves grossly intact. Psych: Normal affect.  LAB RESULTS:  Lab Results  Component Value Date   NA 139 06/14/2013   K 4.1 06/14/2013   CL 108 (H) 06/14/2013   CO2 28 06/14/2013   GLUCOSE 99 06/14/2013   BUN 26 (H) 06/14/2013   CREATININE 1.07 06/14/2013   CALCIUM 8.6 06/14/2013   PROT 7.4 05/27/2011   ALBUMIN 4.1 05/27/2011   AST 21 05/27/2011   ALT 22 05/27/2011   ALKPHOS 67 05/27/2011   BILITOT 0.9 05/27/2011   GFRNONAA 46 (L) 06/14/2013   GFRAA 53 (L) 06/14/2013    Lab Results  Component Value Date   WBC 82.4 (HH) 09/21/2019   NEUTROABS 1.3 (L) 09/21/2019   HGB 7.2 (L) 09/21/2019   HCT 23.1 (L) 09/21/2019   MCV 110.0 (H) 09/21/2019   PLT 91 (L) 09/21/2019     STUDIES: No results found.  ASSESSMENT: CLL, Rai stage 0  PLAN:   1. CLL: Confirmed by peripheral blood flow cytometry.  Patient received minimal Rituxan prior to having a mild reaction and then treatment was discontinued.  Her white blood cell count remains elevated, but essentially unchanged at 82.4.  She continues to decline any further treatments.  Could consider  Imbruvica in the future if necessary.  No intervention is needed at this time.  Have scheduled follow-up appointment in 3 months, but patient indicates she is likely moving to [place] in the next 1 to 2 months.  If so, this appointment will be canceled.     2.  Thrombocytopenia: Chronic and unchanged.  Platelet count is 91 today. 3.  Anemia: Hemoglobin continues to trend down is now 7.2.  Return to clinic later this week for 1 unit of packed red blood cells. 4.  Macrocytosis: Previously B12 and folate were within normal limits.   5.  Heartburn: Continue Protonix as directed.  I provided 30 minutes of face-to-face video visit time during this encounter which included chart review, counseling, and coordination of care as documented above.   Patient expressed understanding and was  in agreement with this plan. She also understands that She can call clinic at any time with any questions, concerns, or complaints.    Lloyd Huger, MD   09/22/2019 9:45 AM    As needed.

## 2019-09-18 ENCOUNTER — Other Ambulatory Visit: Payer: Self-pay

## 2019-09-18 DIAGNOSIS — C9110 Chronic lymphocytic leukemia of B-cell type not having achieved remission: Secondary | ICD-10-CM

## 2019-09-18 DIAGNOSIS — D696 Thrombocytopenia, unspecified: Secondary | ICD-10-CM

## 2019-09-21 ENCOUNTER — Inpatient Hospital Stay (HOSPITAL_BASED_OUTPATIENT_CLINIC_OR_DEPARTMENT_OTHER): Payer: Medicare Other | Admitting: Oncology

## 2019-09-21 ENCOUNTER — Encounter: Payer: Self-pay | Admitting: Oncology

## 2019-09-21 ENCOUNTER — Inpatient Hospital Stay: Payer: Medicare Other | Attending: Oncology

## 2019-09-21 ENCOUNTER — Other Ambulatory Visit: Payer: Self-pay

## 2019-09-21 VITALS — Temp 98.1°F

## 2019-09-21 DIAGNOSIS — C9110 Chronic lymphocytic leukemia of B-cell type not having achieved remission: Secondary | ICD-10-CM | POA: Diagnosis present

## 2019-09-21 DIAGNOSIS — D696 Thrombocytopenia, unspecified: Secondary | ICD-10-CM

## 2019-09-21 DIAGNOSIS — D649 Anemia, unspecified: Secondary | ICD-10-CM

## 2019-09-21 DIAGNOSIS — I10 Essential (primary) hypertension: Secondary | ICD-10-CM | POA: Diagnosis not present

## 2019-09-21 DIAGNOSIS — Z9049 Acquired absence of other specified parts of digestive tract: Secondary | ICD-10-CM | POA: Diagnosis not present

## 2019-09-21 DIAGNOSIS — Z79899 Other long term (current) drug therapy: Secondary | ICD-10-CM | POA: Insufficient documentation

## 2019-09-21 DIAGNOSIS — J449 Chronic obstructive pulmonary disease, unspecified: Secondary | ICD-10-CM | POA: Diagnosis not present

## 2019-09-21 DIAGNOSIS — Z87442 Personal history of urinary calculi: Secondary | ICD-10-CM | POA: Insufficient documentation

## 2019-09-21 DIAGNOSIS — Z85038 Personal history of other malignant neoplasm of large intestine: Secondary | ICD-10-CM | POA: Insufficient documentation

## 2019-09-21 DIAGNOSIS — Z87891 Personal history of nicotine dependence: Secondary | ICD-10-CM | POA: Diagnosis not present

## 2019-09-21 LAB — CBC WITH DIFFERENTIAL/PLATELET
Abs Immature Granulocytes: 0.05 10*3/uL (ref 0.00–0.07)
Basophils Absolute: 0.1 10*3/uL (ref 0.0–0.1)
Basophils Relative: 0 %
Eosinophils Absolute: 0.1 10*3/uL (ref 0.0–0.5)
Eosinophils Relative: 0 %
HCT: 23.1 % — ABNORMAL LOW (ref 36.0–46.0)
Hemoglobin: 7.2 g/dL — ABNORMAL LOW (ref 12.0–15.0)
Immature Granulocytes: 0 %
Lymphocytes Relative: 97 %
Lymphs Abs: 80.5 10*3/uL — ABNORMAL HIGH (ref 0.7–4.0)
MCH: 34.3 pg — ABNORMAL HIGH (ref 26.0–34.0)
MCHC: 31.2 g/dL (ref 30.0–36.0)
MCV: 110 fL — ABNORMAL HIGH (ref 80.0–100.0)
Monocytes Absolute: 0.4 10*3/uL (ref 0.1–1.0)
Monocytes Relative: 1 %
Neutro Abs: 1.3 10*3/uL — ABNORMAL LOW (ref 1.7–7.7)
Neutrophils Relative %: 2 %
Platelets: 91 10*3/uL — ABNORMAL LOW (ref 150–400)
RBC: 2.1 MIL/uL — ABNORMAL LOW (ref 3.87–5.11)
RDW: 14.4 % (ref 11.5–15.5)
Smear Review: NORMAL
WBC: 82.4 10*3/uL (ref 4.0–10.5)
nRBC: 0 % (ref 0.0–0.2)

## 2019-09-21 LAB — SAMPLE TO BLOOD BANK

## 2019-09-21 LAB — FERRITIN: Ferritin: 38 ng/mL (ref 11–307)

## 2019-09-21 LAB — IRON AND TIBC
Iron: 71 ug/dL (ref 28–170)
Saturation Ratios: 25 % (ref 10.4–31.8)
TIBC: 286 ug/dL (ref 250–450)
UIBC: 215 ug/dL

## 2019-09-21 NOTE — Progress Notes (Signed)
Pt with CLL and did have reaction to rituxan and was offered her a oral pill. She is not interested and thinking about moving near her daughter. She is concerned about if she qualifies for the booster shot. She has last vaccine in jan. So she should be a candidate in sept.

## 2019-09-22 ENCOUNTER — Other Ambulatory Visit: Payer: Self-pay

## 2019-09-22 LAB — PREPARE RBC (CROSSMATCH)

## 2019-09-22 LAB — ABO/RH: ABO/RH(D): O POS

## 2019-09-23 ENCOUNTER — Other Ambulatory Visit: Payer: Self-pay

## 2019-09-23 ENCOUNTER — Inpatient Hospital Stay: Payer: Medicare Other

## 2019-09-23 DIAGNOSIS — C9110 Chronic lymphocytic leukemia of B-cell type not having achieved remission: Secondary | ICD-10-CM | POA: Diagnosis not present

## 2019-09-23 DIAGNOSIS — D649 Anemia, unspecified: Secondary | ICD-10-CM

## 2019-09-23 MED ORDER — ACETAMINOPHEN 325 MG PO TABS
650.0000 mg | ORAL_TABLET | Freq: Once | ORAL | Status: AC
  Administered 2019-09-23: 650 mg via ORAL
  Filled 2019-09-23: qty 2

## 2019-09-23 MED ORDER — DIPHENHYDRAMINE HCL 50 MG/ML IJ SOLN
25.0000 mg | Freq: Once | INTRAMUSCULAR | Status: AC
  Administered 2019-09-23: 25 mg via INTRAVENOUS
  Filled 2019-09-23: qty 1

## 2019-09-23 MED ORDER — SODIUM CHLORIDE 0.9% IV SOLUTION
250.0000 mL | Freq: Once | INTRAVENOUS | Status: AC
  Administered 2019-09-23: 250 mL via INTRAVENOUS
  Filled 2019-09-23: qty 250

## 2019-09-23 NOTE — Progress Notes (Signed)
Pt tolerated blood transfusion well with no signs of complications or signs of reaction. All premedications given prior to blood transfusion. VSS. RN educated pt on the importance of notifying the clinic if any complications occur at home. Pt verbalized understanding and all questions answered at this time. Pt stable for discharge.   Karla Pavone [employer]

## 2019-09-24 LAB — TYPE AND SCREEN
ABO/RH(D): O POS
Antibody Screen: NEGATIVE
Unit division: 0

## 2019-09-24 LAB — BPAM RBC
Blood Product Expiration Date: 202109092359
ISSUE DATE / TIME: 202108250946
Unit Type and Rh: 5100

## 2019-10-06 ENCOUNTER — Encounter: Payer: Self-pay | Admitting: Oncology

## 2019-11-10 ENCOUNTER — Encounter: Payer: Self-pay | Admitting: Oncology

## 2019-12-18 ENCOUNTER — Other Ambulatory Visit: Payer: Self-pay | Admitting: *Deleted

## 2019-12-18 DIAGNOSIS — C9110 Chronic lymphocytic leukemia of B-cell type not having achieved remission: Secondary | ICD-10-CM

## 2019-12-19 NOTE — Progress Notes (Signed)
[place]  Telephone:(336) [phone] Fax:(336) [phone]  ID: Stacy Holloway OB: 04-09-1921  MR#: [id number]  MWN#:[id number]  Patient Care Team: Idelle Crouch, MD as PCP - General (Internal Medicine) Lloyd Huger, MD as Consulting Physician (Hematology and Oncology)  CHIEF COMPLAINT: CLL, anemia.  INTERVAL HISTORY: Patient returns to clinic today for repeat laboratory work and further evaluation.  She initially planned to move to [place], but ultimately chose to remain in [place].  She feels significantly improved after receiving 1 unit of blood previously.  She does not complain of weakness or fatigue today.  She has no neurologic complaints.  She denies any fevers or night sweats.  She has a good appetite and denies weight loss.  She denies any chest pain, shortness of breath, cough, or hemoptysis.  She denies any nausea, vomiting, constipation, or diarrhea. She has no urinary complaints.  Patient offers no specific complaints today.  REVIEW OF SYSTEMS:   Review of Systems  Constitutional: Negative.  Negative for fever, malaise/fatigue and weight loss.  Respiratory: Negative.  Negative for cough and shortness of breath.   Cardiovascular: Negative.  Negative for chest pain and leg swelling.  Gastrointestinal: Negative.  Negative for abdominal pain, constipation and heartburn.  Genitourinary: Negative.  Negative for dysuria.  Musculoskeletal: Negative.  Negative for myalgias.  Skin: Negative.  Negative for rash.  Neurological: Negative.  Negative for focal weakness, weakness and headaches.  Psychiatric/Behavioral: Negative.  The patient is not nervous/anxious.     As per HPI. Otherwise, a complete review of systems is negative.  PAST MEDICAL HISTORY: Past Medical History:  Diagnosis Date  . Arthritis   . Cancer [facility])    colon cancer 2009  . Cataract   . COPD (chronic obstructive pulmonary disease) ([place])   . Heart disease   .  Hemorrhoids   . Hypertension   . Indigestion   . Insomnia   . Kidney stone   . Osteoporosis     PAST SURGICAL HISTORY: Past Surgical History:  Procedure Laterality Date  . ABDOMINAL HYSTERECTOMY    . APPENDECTOMY    . BREAST BIOPSY Left    negative 1982  . CHOLECYSTECTOMY    . colon cancer    . COLON RESECTION    . COLONOSCOPY  2008, 2010  . TONSILLECTOMY      FAMILY HISTORY: Family History  Problem Relation Age of Onset  . Breast cancer Neg Hx     ADVANCED DIRECTIVES (Y/N):  N  HEALTH MAINTENANCE: Social History   Tobacco Use  . Smoking status: Former Research scientist (life sciences)  . Smokeless tobacco: Never Used  Vaping Use  . Vaping Use: Never used  Substance Use Topics  . Alcohol use: No  . Drug use: No     Colonoscopy:  PAP:  Bone density:  Lipid panel:  Allergies  Allergen Reactions  . Latex Shortness Of Breath and Other (See Comments)  . Other Shortness Of Breath and Other (See Comments)    Dental work on teeth- there was something on gloves   . Rituxan [Rituximab] Shortness Of Breath    And itching, nasueated  . Sulfa Antibiotics Anaphylaxis    Current Outpatient Medications  Medication Sig Dispense Refill  . carvedilol (COREG) 6.25 MG tablet Take 6.25 mg by mouth 2 (two) times daily with a meal.    . Cholecalciferol (VITAMIN D3) 25 MCG (1000 UT) CAPS Take 1 capsule by mouth daily.    Marland Kitchen docusate sodium (COLACE) 100 MG capsule  Take 100 mg by mouth daily as needed for mild constipation.    . famotidine (PEPCID) 40 MG tablet Take by mouth.    . furosemide (LASIX) 20 MG tablet Take 20 mg by mouth as needed.    . pantoprazole (PROTONIX) 40 MG tablet Take 40 mg by mouth daily as needed.    . ramipril (ALTACE) 5 MG capsule Take 2.5 mg by mouth daily.    . vitamin B-12 (CYANOCOBALAMIN) 1000 MCG tablet Take 1,000 mcg by mouth daily.     No current facility-administered medications for this visit.    OBJECTIVE: Vitals:   12/21/19 1417  BP: (!) 131/58  Pulse: 70   Temp: 98.7 F (37.1 C)  SpO2: 97%     Body mass index is 27.08 kg/m.    ECOG FS:0 - Asymptomatic  General: Well-developed, well-nourished, no acute distress. Eyes: Pink conjunctiva, anicteric sclera. HEENT: Normocephalic, moist mucous membranes. Lungs: No audible wheezing or coughing. Heart: Regular rate and rhythm. Abdomen: Soft, nontender, no obvious distention. Musculoskeletal: No edema, cyanosis, or clubbing. Neuro: Alert, answering all questions appropriately. Cranial nerves grossly intact. Skin: No rashes or petechiae noted. Psych: Normal affect.  LAB RESULTS:  Lab Results  Component Value Date   NA 139 06/14/2013   K 4.1 06/14/2013   CL 108 (H) 06/14/2013   CO2 28 06/14/2013   GLUCOSE 99 06/14/2013   BUN 26 (H) 06/14/2013   CREATININE 1.07 06/14/2013   CALCIUM 8.6 06/14/2013   PROT 7.4 05/27/2011   ALBUMIN 4.1 05/27/2011   AST 21 05/27/2011   ALT 22 05/27/2011   ALKPHOS 67 05/27/2011   BILITOT 0.9 05/27/2011   GFRNONAA 46 (L) 06/14/2013   GFRAA 53 (L) 06/14/2013    Lab Results  Component Value Date   WBC 89.8 (HH) 12/21/2019   NEUTROABS 1.3 (L) 12/21/2019   HGB 7.5 (L) 12/21/2019   HCT 24.0 (L) 12/21/2019   MCV 111.1 (H) 12/21/2019   PLT 89 (L) 12/21/2019     STUDIES: No results found.  ASSESSMENT: CLL, Rai stage 0  PLAN:   1. CLL: Confirmed by peripheral blood flow cytometry.  Patient received minimal Rituxan prior to having a mild reaction and then treatment was discontinued.  Her white blood cell count remains elevated, but essentially unchanged at 89.8.  She continues to decline any further treatments.  Could consider Imbruvica in the future if necessary.  No intervention is needed at this time.  Return to clinic in 3 months with repeat laboratory work and further evaluation. 2.  Thrombocytopenia: Chronic and unchanged.  Patient's platelet count is 89 today. 3.  Anemia: Although patient's hemoglobin has improved to 7.5, she is no longer  symptomatic.  She does not require additional transfusion today.  Return to clinic as above for repeat laboratory work and evaluation. 4.  Macrocytosis: Chronic and unchanged. Previously B12 and folate were within normal limits.   5.  Heartburn: Patient does not complain of this today.  Continue Protonix as directed.   Patient expressed understanding and was in agreement with this plan. She also understands that She can call clinic at any time with any questions, concerns, or complaints.    Lloyd Huger, MD   12/22/2019 11:25 AM    As needed.

## 2019-12-21 ENCOUNTER — Inpatient Hospital Stay: Payer: Medicare Other | Attending: Oncology

## 2019-12-21 ENCOUNTER — Encounter: Payer: Self-pay | Admitting: Oncology

## 2019-12-21 ENCOUNTER — Inpatient Hospital Stay (HOSPITAL_BASED_OUTPATIENT_CLINIC_OR_DEPARTMENT_OTHER): Payer: Medicare Other | Admitting: Oncology

## 2019-12-21 VITALS — BP 131/58 | HR 70 | Temp 98.7°F | Wt 143.3 lb

## 2019-12-21 DIAGNOSIS — D7589 Other specified diseases of blood and blood-forming organs: Secondary | ICD-10-CM | POA: Diagnosis not present

## 2019-12-21 DIAGNOSIS — Z87891 Personal history of nicotine dependence: Secondary | ICD-10-CM | POA: Diagnosis not present

## 2019-12-21 DIAGNOSIS — Z79899 Other long term (current) drug therapy: Secondary | ICD-10-CM | POA: Diagnosis not present

## 2019-12-21 DIAGNOSIS — M1990 Unspecified osteoarthritis, unspecified site: Secondary | ICD-10-CM | POA: Diagnosis not present

## 2019-12-21 DIAGNOSIS — I10 Essential (primary) hypertension: Secondary | ICD-10-CM | POA: Insufficient documentation

## 2019-12-21 DIAGNOSIS — J449 Chronic obstructive pulmonary disease, unspecified: Secondary | ICD-10-CM | POA: Diagnosis not present

## 2019-12-21 DIAGNOSIS — D649 Anemia, unspecified: Secondary | ICD-10-CM | POA: Insufficient documentation

## 2019-12-21 DIAGNOSIS — C9110 Chronic lymphocytic leukemia of B-cell type not having achieved remission: Secondary | ICD-10-CM

## 2019-12-21 DIAGNOSIS — D696 Thrombocytopenia, unspecified: Secondary | ICD-10-CM | POA: Insufficient documentation

## 2019-12-21 LAB — CBC WITH DIFFERENTIAL/PLATELET
Abs Immature Granulocytes: 0.06 10*3/uL (ref 0.00–0.07)
Basophils Absolute: 0 10*3/uL (ref 0.0–0.1)
Basophils Relative: 0 %
Eosinophils Absolute: 0.1 10*3/uL (ref 0.0–0.5)
Eosinophils Relative: 0 %
HCT: 24 % — ABNORMAL LOW (ref 36.0–46.0)
Hemoglobin: 7.5 g/dL — ABNORMAL LOW (ref 12.0–15.0)
Immature Granulocytes: 0 %
Lymphocytes Relative: 97 %
Lymphs Abs: 86.9 10*3/uL — ABNORMAL HIGH (ref 0.7–4.0)
MCH: 34.7 pg — ABNORMAL HIGH (ref 26.0–34.0)
MCHC: 31.3 g/dL (ref 30.0–36.0)
MCV: 111.1 fL — ABNORMAL HIGH (ref 80.0–100.0)
Monocytes Absolute: 1.5 10*3/uL — ABNORMAL HIGH (ref 0.1–1.0)
Monocytes Relative: 2 %
Neutro Abs: 1.3 10*3/uL — ABNORMAL LOW (ref 1.7–7.7)
Neutrophils Relative %: 1 %
Platelets: 89 10*3/uL — ABNORMAL LOW (ref 150–400)
RBC: 2.16 MIL/uL — ABNORMAL LOW (ref 3.87–5.11)
RDW: 16.1 % — ABNORMAL HIGH (ref 11.5–15.5)
Smear Review: NORMAL
WBC: 89.8 10*3/uL (ref 4.0–10.5)
nRBC: 0 % (ref 0.0–0.2)

## 2020-03-21 ENCOUNTER — Inpatient Hospital Stay: Payer: Medicare Other | Admitting: Oncology

## 2020-03-21 ENCOUNTER — Other Ambulatory Visit: Payer: Self-pay

## 2020-03-21 ENCOUNTER — Inpatient Hospital Stay: Payer: Medicare Other | Attending: Oncology

## 2020-03-21 VITALS — BP 115/49 | HR 61 | Temp 98.4°F | Resp 18 | Wt 141.0 lb

## 2020-03-21 DIAGNOSIS — D7589 Other specified diseases of blood and blood-forming organs: Secondary | ICD-10-CM | POA: Diagnosis not present

## 2020-03-21 DIAGNOSIS — C9110 Chronic lymphocytic leukemia of B-cell type not having achieved remission: Secondary | ICD-10-CM

## 2020-03-21 DIAGNOSIS — J449 Chronic obstructive pulmonary disease, unspecified: Secondary | ICD-10-CM | POA: Diagnosis not present

## 2020-03-21 DIAGNOSIS — Z85038 Personal history of other malignant neoplasm of large intestine: Secondary | ICD-10-CM | POA: Insufficient documentation

## 2020-03-21 DIAGNOSIS — D649 Anemia, unspecified: Secondary | ICD-10-CM | POA: Insufficient documentation

## 2020-03-21 DIAGNOSIS — Z90710 Acquired absence of both cervix and uterus: Secondary | ICD-10-CM | POA: Insufficient documentation

## 2020-03-21 DIAGNOSIS — D6959 Other secondary thrombocytopenia: Secondary | ICD-10-CM | POA: Diagnosis not present

## 2020-03-21 DIAGNOSIS — Z79899 Other long term (current) drug therapy: Secondary | ICD-10-CM | POA: Diagnosis not present

## 2020-03-21 DIAGNOSIS — Z87891 Personal history of nicotine dependence: Secondary | ICD-10-CM | POA: Diagnosis not present

## 2020-03-21 DIAGNOSIS — I10 Essential (primary) hypertension: Secondary | ICD-10-CM | POA: Diagnosis not present

## 2020-03-21 LAB — CBC WITH DIFFERENTIAL/PLATELET
Abs Immature Granulocytes: 0.05 10*3/uL (ref 0.00–0.07)
Basophils Absolute: 0 10*3/uL (ref 0.0–0.1)
Basophils Relative: 0 %
Eosinophils Absolute: 0.1 10*3/uL (ref 0.0–0.5)
Eosinophils Relative: 0 %
HCT: 24.5 % — ABNORMAL LOW (ref 36.0–46.0)
Hemoglobin: 7.5 g/dL — ABNORMAL LOW (ref 12.0–15.0)
Immature Granulocytes: 0 %
Lymphocytes Relative: 97 %
Lymphs Abs: 75.2 10*3/uL — ABNORMAL HIGH (ref 0.7–4.0)
MCH: 34.9 pg — ABNORMAL HIGH (ref 26.0–34.0)
MCHC: 30.6 g/dL (ref 30.0–36.0)
MCV: 114 fL — ABNORMAL HIGH (ref 80.0–100.0)
Monocytes Absolute: 0.8 10*3/uL (ref 0.1–1.0)
Monocytes Relative: 1 %
Neutro Abs: 1.3 10*3/uL — ABNORMAL LOW (ref 1.7–7.7)
Neutrophils Relative %: 2 %
Platelets: 97 10*3/uL — ABNORMAL LOW (ref 150–400)
RBC: 2.15 MIL/uL — ABNORMAL LOW (ref 3.87–5.11)
RDW: 13.5 % (ref 11.5–15.5)
Smear Review: NORMAL
WBC: 77.4 10*3/uL (ref 4.0–10.5)
nRBC: 0 % (ref 0.0–0.2)

## 2020-03-21 LAB — IRON AND TIBC
Iron: 65 ug/dL (ref 28–170)
Saturation Ratios: 23 % (ref 10.4–31.8)
TIBC: 287 ug/dL (ref 250–450)
UIBC: 222 ug/dL

## 2020-03-21 LAB — FERRITIN: Ferritin: 60 ng/mL (ref 11–307)

## 2020-03-21 NOTE — Progress Notes (Signed)
[place]  Telephone:(336) [phone] Fax:(336) [phone]  ID: Stacy Holloway OB: 04-15-1921  MR#: [id number]  IHK#:[id number]  Patient Care Team: Idelle Crouch, MD as PCP - General (Internal Medicine) Lloyd Huger, MD as Consulting Physician (Hematology and Oncology)  CHIEF COMPLAINT: CLL, anemia.  INTERVAL HISTORY: Patient returns to clinic today for repeat laboratory work and further evaluation.  She was last seen in clinic on 12/21/2019.  In the interim Stacy Holloway has done well.  She continues to live independently.  Her daughter and husband live in [place] and visit often. She just finished a 5-day course of Cipro due to UTI.  She states she had diarrhea a few days last week that she thinks was due to some "bad turnips".  She continues to recover from UTI but overall feels stable since her blood transfusion.  She denies any significant changes since her last visit.  Her appetite is good.  She denies any unintentional weight loss.  Denies any B symptoms.  Denies any recent fevers or illness.  She has no neurological complaints. She denies any chest pain, shortness of breath, cough, or hemoptysis.  She denies any nausea, vomiting, constipation, or diarrhea. She has no urinary complaints.  Patient offers no specific complaints today.  REVIEW OF SYSTEMS:   Review of Systems  Constitutional: Positive for malaise/fatigue. Negative for fever and weight loss.  Respiratory: Negative.  Negative for cough and shortness of breath.   Cardiovascular: Negative.  Negative for chest pain and leg swelling.  Gastrointestinal: Positive for diarrhea. Negative for abdominal pain, constipation and heartburn.  Genitourinary: Positive for dysuria (Just completed Cipro for UTI).  Musculoskeletal: Negative.  Negative for myalgias.  Skin: Negative.  Negative for rash.  Neurological: Negative.  Negative for focal weakness, weakness and headaches.   Psychiatric/Behavioral: Negative.  The patient is not nervous/anxious.     As per HPI. Otherwise, a complete review of systems is negative.  PAST MEDICAL HISTORY: Past Medical History:  Diagnosis Date  . Arthritis   . Cancer [facility])    colon cancer 2009  . Cataract   . COPD (chronic obstructive pulmonary disease) ([place])   . Heart disease   . Hemorrhoids   . Hypertension   . Indigestion   . Insomnia   . Kidney stone   . Osteoporosis     PAST SURGICAL HISTORY: Past Surgical History:  Procedure Laterality Date  . ABDOMINAL HYSTERECTOMY    . APPENDECTOMY    . BREAST BIOPSY Left    negative 1982  . CHOLECYSTECTOMY    . colon cancer    . COLON RESECTION    . COLONOSCOPY  2008, 2010  . TONSILLECTOMY      FAMILY HISTORY: Family History  Problem Relation Age of Onset  . Breast cancer Neg Hx     ADVANCED DIRECTIVES (Y/N):  N  HEALTH MAINTENANCE: Social History   Tobacco Use  . Smoking status: Former Research scientist (life sciences)  . Smokeless tobacco: Never Used  Vaping Use  . Vaping Use: Never used  Substance Use Topics  . Alcohol use: No  . Drug use: No     Colonoscopy:  PAP:  Bone density:  Lipid panel:  Allergies  Allergen Reactions  . Latex Shortness Of Breath and Other (See Comments)  . Other Shortness Of Breath and Other (See Comments)    Dental work on teeth- there was something on gloves   . Rituxan [Rituximab] Shortness Of Breath    And itching,  nasueated  . Sulfa Antibiotics Anaphylaxis    Current Outpatient Medications  Medication Sig Dispense Refill  . carvedilol (COREG) 6.25 MG tablet Take 6.25 mg by mouth 2 (two) times daily with a meal.    . Cholecalciferol (VITAMIN D3) 25 MCG (1000 UT) CAPS Take 1 capsule by mouth daily.    Marland Kitchen docusate sodium (COLACE) 100 MG capsule Take 100 mg by mouth daily as needed for mild constipation.    . famotidine (PEPCID) 40 MG tablet Take by mouth.    . pantoprazole (PROTONIX) 40 MG tablet Take 40 mg by mouth daily as needed.     . ramipril (ALTACE) 5 MG capsule Take 2.5 mg by mouth daily.    . vitamin B-12 (CYANOCOBALAMIN) 1000 MCG tablet Take 1,000 mcg by mouth daily.    . furosemide (LASIX) 20 MG tablet Take 20 mg by mouth as needed. (Patient not taking: Reported on 03/21/2020)     No current facility-administered medications for this visit.    OBJECTIVE: There were no vitals filed for this visit.   There is no height or weight on file to calculate BMI.    ECOG FS:0 - Asymptomatic  Physical Exam Constitutional:      General: Vital signs are normal.     Appearance: Normal appearance.  HENT:     Head: Normocephalic and atraumatic.  Eyes:     Pupils: Pupils are equal, round, and reactive to light.  Cardiovascular:     Rate and Rhythm: Normal rate and regular rhythm.     Heart sounds: Normal heart sounds. No murmur heard.   Pulmonary:     Effort: Pulmonary effort is normal.     Breath sounds: Normal breath sounds. No wheezing.  Abdominal:     General: Bowel sounds are normal. There is no distension.     Palpations: Abdomen is soft.     Tenderness: There is no abdominal tenderness.  Musculoskeletal:        General: No edema. Normal range of motion.     Cervical back: Normal range of motion.  Skin:    General: Skin is warm and dry.     Findings: No rash.  Neurological:     Mental Status: She is alert and oriented to person, place, and time.  Psychiatric:        Judgment: Judgment normal.     LAB RESULTS:  Lab Results  Component Value Date   NA 139 06/14/2013   K 4.1 06/14/2013   CL 108 (H) 06/14/2013   CO2 28 06/14/2013   GLUCOSE 99 06/14/2013   BUN 26 (H) 06/14/2013   CREATININE 1.07 06/14/2013   CALCIUM 8.6 06/14/2013   PROT 7.4 05/27/2011   ALBUMIN 4.1 05/27/2011   AST 21 05/27/2011   ALT 22 05/27/2011   ALKPHOS 67 05/27/2011   BILITOT 0.9 05/27/2011   GFRNONAA 46 (L) 06/14/2013   GFRAA 53 (L) 06/14/2013    Lab Results  Component Value Date   WBC 89.8 (HH) 12/21/2019    NEUTROABS 1.3 (L) 12/21/2019   HGB 7.5 (L) 12/21/2019   HCT 24.0 (L) 12/21/2019   MCV 111.1 (H) 12/21/2019   PLT 89 (L) 12/21/2019     STUDIES: No results found.  ASSESSMENT: CLL, Rai stage 0  PLAN:   1. CLL: -Confirmed by peripheral flow cytometry -Received Rituxan but ultimately discontinued secondary to reaction -On surveillance. -Denies any B symptoms. -Her white count remains persistently elevated but unchanged. -Labs from today show a white count  of 77.4. (89.8) -Previously she has declined any additional treatments. -She will return to clinic in 3 months for repeat labs and further evaluation.  2.  Thrombocytopenia:  -Secondary to CLL. -Labs from 03/21/2020 show a platelet count of 97,000.  3.  Anemia:  -Patient's hemoglobin at baseline is around 7.5. -Hemoglobin from 03/21/2020 is 7.5.  4.  Macrocytosis:  -MCV 114.0. -Chronic and unchanged. -Previously B12 and folate were within normal range.  Disposition: -RTC in 3 months with repeat labs and MD assessment.  Greater than 50% was spent in counseling and coordination of care with this patient including but not limited to discussion of the relevant topics above (See A&P) including, but not limited to diagnosis and management of acute and chronic medical conditions.   Patient expressed understanding and was in agreement with this plan. She also understands that She can call clinic at any time with any questions, concerns, or complaints.    Jacquelin Hawking, NP   03/21/2020 2:12 PM    As needed.

## 2020-06-20 ENCOUNTER — Other Ambulatory Visit: Payer: Self-pay

## 2020-06-20 ENCOUNTER — Encounter: Payer: Self-pay | Admitting: Oncology

## 2020-06-20 ENCOUNTER — Inpatient Hospital Stay (HOSPITAL_BASED_OUTPATIENT_CLINIC_OR_DEPARTMENT_OTHER): Payer: Medicare Other | Admitting: Oncology

## 2020-06-20 ENCOUNTER — Inpatient Hospital Stay: Payer: Medicare Other | Attending: Oncology

## 2020-06-20 VITALS — BP 120/53 | HR 59 | Temp 98.6°F | Resp 18 | Wt 139.0 lb

## 2020-06-20 DIAGNOSIS — D7589 Other specified diseases of blood and blood-forming organs: Secondary | ICD-10-CM | POA: Diagnosis not present

## 2020-06-20 DIAGNOSIS — D649 Anemia, unspecified: Secondary | ICD-10-CM

## 2020-06-20 DIAGNOSIS — Z87891 Personal history of nicotine dependence: Secondary | ICD-10-CM | POA: Diagnosis not present

## 2020-06-20 DIAGNOSIS — R5382 Chronic fatigue, unspecified: Secondary | ICD-10-CM | POA: Insufficient documentation

## 2020-06-20 DIAGNOSIS — Z79899 Other long term (current) drug therapy: Secondary | ICD-10-CM | POA: Insufficient documentation

## 2020-06-20 DIAGNOSIS — J449 Chronic obstructive pulmonary disease, unspecified: Secondary | ICD-10-CM | POA: Insufficient documentation

## 2020-06-20 DIAGNOSIS — R531 Weakness: Secondary | ICD-10-CM | POA: Diagnosis not present

## 2020-06-20 DIAGNOSIS — C9110 Chronic lymphocytic leukemia of B-cell type not having achieved remission: Secondary | ICD-10-CM | POA: Insufficient documentation

## 2020-06-20 DIAGNOSIS — I10 Essential (primary) hypertension: Secondary | ICD-10-CM | POA: Insufficient documentation

## 2020-06-20 DIAGNOSIS — R5381 Other malaise: Secondary | ICD-10-CM | POA: Insufficient documentation

## 2020-06-20 DIAGNOSIS — Z9225 Personal history of immunosupression therapy: Secondary | ICD-10-CM | POA: Diagnosis not present

## 2020-06-20 LAB — CBC WITH DIFFERENTIAL/PLATELET
Abs Immature Granulocytes: 0.04 10*3/uL (ref 0.00–0.07)
Basophils Absolute: 0 10*3/uL (ref 0.0–0.1)
Basophils Relative: 0 %
Eosinophils Absolute: 0.1 10*3/uL (ref 0.0–0.5)
Eosinophils Relative: 0 %
HCT: 22.2 % — ABNORMAL LOW (ref 36.0–46.0)
Hemoglobin: 7 g/dL — ABNORMAL LOW (ref 12.0–15.0)
Immature Granulocytes: 0 %
Lymphocytes Relative: 97 %
Lymphs Abs: 64.5 10*3/uL — ABNORMAL HIGH (ref 0.7–4.0)
MCH: 35 pg — ABNORMAL HIGH (ref 26.0–34.0)
MCHC: 31.5 g/dL (ref 30.0–36.0)
MCV: 111 fL — ABNORMAL HIGH (ref 80.0–100.0)
Monocytes Absolute: 0.7 10*3/uL (ref 0.1–1.0)
Monocytes Relative: 1 %
Neutro Abs: 1.1 10*3/uL — ABNORMAL LOW (ref 1.7–7.7)
Neutrophils Relative %: 2 %
Platelets: 77 10*3/uL — ABNORMAL LOW (ref 150–400)
RBC: 2 MIL/uL — ABNORMAL LOW (ref 3.87–5.11)
RDW: 14.6 % (ref 11.5–15.5)
Smear Review: NORMAL
WBC: 66.4 10*3/uL (ref 4.0–10.5)
nRBC: 0 % (ref 0.0–0.2)

## 2020-06-20 NOTE — Progress Notes (Signed)
[place]  Telephone:(336) [phone] Fax:(336) [phone]  ID: ALONNIE BIEKER OB: [birth year]  MR#: [id number]  RCV#:[id number]  Patient Care Team: Idelle Crouch, MD as PCP - General (Internal Medicine) Lloyd Huger, MD as Consulting Physician (Hematology and Oncology)  CHIEF COMPLAINT: CLL, anemia.  INTERVAL HISTORY: Patient returns to clinic today for repeat laboratory work and further evaluation.  She was last seen in clinic on 03/21/20.  In the interim Mrs. Tomlin has done well.  She continues to live independently.  Her daughter and husband live in [place] and visit often.  She has chronic fatigue but is able to carry on with normal everyday activities.  Has occasional weak spells but rest when needed.  Appetite is stable.  She denies any significant changes since her last visit.  She denies any unintentional weight loss.  Denies any B symptoms.  Denies any recent fevers or illness.  She has no neurological complaints. She denies any chest pain, shortness of breath, cough, or hemoptysis.  She denies any nausea, vomiting, constipation, or diarrhea. She has no urinary complaints.  Patient offers no specific complaints today.  REVIEW OF SYSTEMS:   Review of Systems  Constitutional: Positive for malaise/fatigue. Negative for chills, fever and weight loss.  HENT: Negative for congestion, ear pain and tinnitus.   Eyes: Negative.  Negative for blurred vision and double vision.  Respiratory: Negative.  Negative for cough, sputum production and shortness of breath.   Cardiovascular: Negative.  Negative for chest pain, palpitations and leg swelling.  Gastrointestinal: Negative.  Negative for abdominal pain, constipation, diarrhea, nausea and vomiting.  Genitourinary: Negative for dysuria, frequency and urgency.  Musculoskeletal: Negative for back pain and falls.  Skin: Negative.  Negative for rash.  Neurological: Positive for weakness. Negative for  headaches.  Endo/Heme/Allergies: Negative.  Does not bruise/bleed easily.  Psychiatric/Behavioral: Negative.  Negative for depression. The patient is not nervous/anxious and does not have insomnia.     As per HPI. Otherwise, a complete review of systems is negative.  PAST MEDICAL HISTORY: Past Medical History:  Diagnosis Date  . Arthritis   . Cancer [facility])    colon cancer 2009  . Cataract   . COPD (chronic obstructive pulmonary disease) ([place])   . Heart disease   . Hemorrhoids   . Hypertension   . Indigestion   . Insomnia   . Kidney stone   . Osteoporosis     PAST SURGICAL HISTORY: Past Surgical History:  Procedure Laterality Date  . ABDOMINAL HYSTERECTOMY    . APPENDECTOMY    . BREAST BIOPSY Left    negative 1982  . CHOLECYSTECTOMY    . colon cancer    . COLON RESECTION    . COLONOSCOPY  2008, 2010  . TONSILLECTOMY      FAMILY HISTORY: Family History  Problem Relation Age of Onset  . Breast cancer Neg Hx     ADVANCED DIRECTIVES (Y/N):  N  HEALTH MAINTENANCE: Social History   Tobacco Use  . Smoking status: Former Research scientist (life sciences)  . Smokeless tobacco: Never Used  Vaping Use  . Vaping Use: Never used  Substance Use Topics  . Alcohol use: No  . Drug use: No     Colonoscopy:  PAP:  Bone density:  Lipid panel:  Allergies  Allergen Reactions  . Latex Shortness Of Breath and Other (See Comments)  . Other Shortness Of Breath and Other (See Comments)    Dental work on teeth- there was something  on gloves   . Rituxan [Rituximab] Shortness Of Breath    And itching, nasueated  . Sulfa Antibiotics Anaphylaxis    Current Outpatient Medications  Medication Sig Dispense Refill  . carvedilol (COREG) 6.25 MG tablet Take 6.25 mg by mouth 2 (two) times daily with a meal.    . Cholecalciferol (VITAMIN D3) 25 MCG (1000 UT) CAPS Take 1 capsule by mouth daily.    Marland Kitchen docusate sodium (COLACE) 100 MG capsule Take 100 mg by mouth daily as needed for mild constipation.    .  famotidine (PEPCID) 40 MG tablet Take by mouth.    . pantoprazole (PROTONIX) 40 MG tablet Take 40 mg by mouth daily as needed.    . ramipril (ALTACE) 5 MG capsule Take 2.5 mg by mouth daily.    . vitamin B-12 (CYANOCOBALAMIN) 1000 MCG tablet Take 1,000 mcg by mouth daily.    . furosemide (LASIX) 20 MG tablet Take 20 mg by mouth as needed. (Patient not taking: Reported on 03/21/2020)     No current facility-administered medications for this visit.    OBJECTIVE: Vitals:   06/20/20 1426  BP: (!) 120/53  Pulse: (!) 59  Resp: 18  Temp: 98.6 F (37 C)  SpO2: 100%     Body mass index is 26.26 kg/m.    ECOG FS:0 - Asymptomatic  Physical Exam Constitutional:      Appearance: Normal appearance.  HENT:     Head: Normocephalic and atraumatic.  Eyes:     Pupils: Pupils are equal, round, and reactive to light.  Cardiovascular:     Rate and Rhythm: Normal rate and regular rhythm.     Heart sounds: Normal heart sounds. No murmur heard.   Pulmonary:     Effort: Pulmonary effort is normal.     Breath sounds: Normal breath sounds. No wheezing.  Abdominal:     General: Bowel sounds are normal. There is no distension.     Palpations: Abdomen is soft.     Tenderness: There is no abdominal tenderness.  Musculoskeletal:        General: Normal range of motion.     Cervical back: Normal range of motion.  Skin:    General: Skin is warm and dry.     Findings: No rash.  Neurological:     Mental Status: She is alert and oriented to person, place, and time.  Psychiatric:        Judgment: Judgment normal.     LAB RESULTS:  Lab Results  Component Value Date   NA 139 06/14/2013   K 4.1 06/14/2013   CL 108 (H) 06/14/2013   CO2 28 06/14/2013   GLUCOSE 99 06/14/2013   BUN 26 (H) 06/14/2013   CREATININE 1.07 06/14/2013   CALCIUM 8.6 06/14/2013   PROT 7.4 05/27/2011   ALBUMIN 4.1 05/27/2011   AST 21 05/27/2011   ALT 22 05/27/2011   ALKPHOS 67 05/27/2011   BILITOT 0.9 05/27/2011    GFRNONAA 46 (L) 06/14/2013   GFRAA 53 (L) 06/14/2013    Lab Results  Component Value Date   WBC 77.4 (HH) 03/21/2020   NEUTROABS 1.3 (L) 03/21/2020   HGB 7.5 (L) 03/21/2020   HCT 24.5 (L) 03/21/2020   MCV 114.0 (H) 03/21/2020   PLT 97 (L) 03/21/2020     STUDIES: No results found.  ASSESSMENT: CLL, Rai stage 0  PLAN:   1. CLL: -Confirmed by peripheral flow cytometry -Received Rituxan but ultimately discontinued secondary to reaction. -On surveillance. -Denies  any B symptoms. -Her white count remains persistently elevated but unchanged. -Labs from today show a white count of 66.4 (77.4). -She continues to not be interested in any additional treatments at this time. -Is agreeable to blood if needed. -She will return to clinic in 3 months for repeat labs and further evaluation.  2.  Thrombocytopenia:  -Secondary to CLL. -Labs from 06/20/20 show a platelet count of 77,000.   3.  Anemia:  -Patient's hemoglobin at baseline is around 7.5. -Hemoglobin from 06/20/20 is 7.0 - Feeling more tired than usual.  Would recommend 1 unit packed red blood cells.  Patient is in agreement.  4.  Macrocytosis:  -MCV 111.0 -Chronic and unchanged. -Previously B12 and folate were within normal range.  Disposition: - 1 unit packed red blood cells in the next week or so. -RTC in 3 months with repeat labs and MD assessment.  Greater than 50% was spent in counseling and coordination of care with this patient including but not limited to discussion of the relevant topics above (See A&P) including, but not limited to diagnosis and management of acute and chronic medical conditions.   Patient expressed understanding and was in agreement with this plan. She also understands that She can call clinic at any time with any questions, concerns, or complaints.    Jacquelin Hawking, NP   06/20/2020 2:45 PM

## 2020-06-21 ENCOUNTER — Other Ambulatory Visit: Payer: Self-pay | Admitting: Oncology

## 2020-06-21 DIAGNOSIS — D649 Anemia, unspecified: Secondary | ICD-10-CM

## 2020-06-21 LAB — PREPARE RBC (CROSSMATCH)

## 2020-06-22 ENCOUNTER — Other Ambulatory Visit: Payer: Self-pay

## 2020-06-22 ENCOUNTER — Inpatient Hospital Stay: Payer: Medicare Other

## 2020-06-22 DIAGNOSIS — C9110 Chronic lymphocytic leukemia of B-cell type not having achieved remission: Secondary | ICD-10-CM | POA: Diagnosis not present

## 2020-06-22 DIAGNOSIS — D649 Anemia, unspecified: Secondary | ICD-10-CM

## 2020-06-22 MED ORDER — ACETAMINOPHEN 325 MG PO TABS
650.0000 mg | ORAL_TABLET | Freq: Once | ORAL | Status: AC
  Administered 2020-06-22: 650 mg via ORAL
  Filled 2020-06-22: qty 2

## 2020-06-22 MED ORDER — SODIUM CHLORIDE 0.9% IV SOLUTION
250.0000 mL | Freq: Once | INTRAVENOUS | Status: AC
  Administered 2020-06-22: 250 mL via INTRAVENOUS
  Filled 2020-06-22: qty 250

## 2020-06-22 MED ORDER — DIPHENHYDRAMINE HCL 25 MG PO CAPS
25.0000 mg | ORAL_CAPSULE | Freq: Once | ORAL | Status: AC
Start: 2020-06-22 — End: 2020-06-22
  Administered 2020-06-22: 25 mg via ORAL
  Filled 2020-06-22: qty 1

## 2020-06-22 NOTE — Progress Notes (Signed)
Pt received 1u of pRBCs in clinic today. Tolerated well. VSS @ d/c. 

## 2020-06-22 NOTE — Patient Instructions (Signed)
[facility]    Discharge Instructions: Thank you for choosing [facility] to provide your oncology and hematology care.  If you have a lab appointment with the [facility], please go directly to the [facility] and check in at the registration area.  Wear comfortable clothing and clothing appropriate for easy access to any Portacath or PICC line.   We strive to give you quality time with your provider. You may need to reschedule your appointment if you arrive late (15 or more minutes).  Arriving late affects you and other patients whose appointments are after yours.  Also, if you miss three or more appointments without notifying the office, you may be dismissed from the clinic at the provider's discretion.      For prescription refill requests, have your pharmacy contact our office and allow 72 hours for refills to be completed.    BELOW ARE SYMPTOMS THAT SHOULD BE REPORTED IMMEDIATELY: . *FEVER GREATER THAN 100.4 F (38 C) OR HIGHER . *CHILLS OR SWEATING . *NAUSEA AND VOMITING THAT IS NOT CONTROLLED WITH YOUR NAUSEA MEDICATION . *UNUSUAL SHORTNESS OF BREATH . *UNUSUAL BRUISING OR BLEEDING . *URINARY PROBLEMS (pain or burning when urinating, or frequent urination) . *BOWEL PROBLEMS (unusual diarrhea, constipation, pain near the anus) . TENDERNESS IN MOUTH AND THROAT WITH OR WITHOUT PRESENCE OF ULCERS (sore throat, sores in mouth, or a toothache) . UNUSUAL RASH, SWELLING OR PAIN  . UNUSUAL VAGINAL DISCHARGE OR ITCHING   Items with * indicate a potential emergency and should be followed up as soon as possible or go to the Emergency Department if any problems should occur.   Should you have questions after your visit or need to cancel or reschedule your appointment, please contact [facility]  [phone] and follow the prompts.  Office hours are 8:00 a.m. to 4:30 p.m. Monday - Friday. Please note  that voicemails left after 4:00 p.m. may not be returned until the following business day.  We are closed weekends and major holidays. You have access to a nurse at all times for urgent questions. Please call the main number to the clinic [phone] and follow the prompts.  For any non-urgent questions, you may also contact your provider using MyChart. We now offer e-Visits for anyone 3 and older to request care online for non-urgent symptoms. For details visit [email or web address].   Also download the MyChart app! Go to the app store, search "MyChart", open the app, select [facility], and log in with your MyChart username and password.  Due to Covid, a mask is required upon entering the hospital/clinic. If you do not have a mask, one will be given to you upon arrival. For doctor visits, patients may have 1 support person aged 17 or older with them. For treatment visits, patients cannot have anyone with them due to current Covid guidelines and our immunocompromised population.   Acetaminophen tablets or caplets What is this medicine? ACETAMINOPHEN (a set a MEE noe fen) is a pain reliever. It is used to treat mild pain and fever. This medicine may be used for other purposes; ask your health care provider or pharmacist if you have questions. COMMON BRAND NAME(S): Aceta, Actamin, Anacin Aspirin Free, Genapap, Genebs, Mapap, Pain & Fever, Pain and Fever, PAIN RELIEF, PAIN RELIEF Extra Strength, Pain Reliever, Panadol, PHARBETOL, Plus PHARMA, Q-Pap, Q-Pap Extra Strength, Tylenol, Tylenol CrushableTablet, Tylenol Extra Strength, Tylenol Regular Strength, XS No Aspirin, XS Pain Reliever What should  I tell my health care provider before I take this medicine? They need to know if you have any of these conditions:  if you often drink alcohol  liver disease  an unusual or allergic reaction to acetaminophen, other medicines, foods, dyes, or preservatives  pregnant or trying to get  pregnant  breast-feeding How should I use this medicine? Take this medicine by mouth with a glass of water. Follow the directions on the package or prescription label. Take your medicine at regular intervals. Do not take your medicine more often than directed. Talk to your pediatrician regarding the use of this medicine in children. While this drug may be prescribed for children as young as 39 years of age for selected conditions, precautions do apply. Overdosage: If you think you have taken too much of this medicine contact a poison control center or emergency room at once. NOTE: This medicine is only for you. Do not share this medicine with others. What if I miss a dose? If you miss a dose, take it as soon as you can. If it is almost time for your next dose, take only that dose. Do not take double or extra doses. What may interact with this medicine?  alcohol  imatinib  isoniazid  other medicines with acetaminophen This list may not describe all possible interactions. Give your health care provider a list of all the medicines, herbs, non-prescription drugs, or dietary supplements you use. Also tell them if you smoke, drink alcohol, or use illegal drugs. Some items may interact with your medicine. What should I watch for while using this medicine? Tell your doctor or health care professional if the pain lasts more than 10 days (5 days for children), if it gets worse, or if there is a new or different kind of pain. Also, check with your doctor if a fever lasts for more than 3 days. Do not take other medicines that contain acetaminophen with this medicine. Always read labels carefully. If you have questions, ask your doctor or pharmacist. If you take too much acetaminophen get medical help right away. Too much acetaminophen can be very dangerous and cause liver damage. Even if you do not have symptoms, it is important to get help right away. What side effects may I notice from receiving this  medicine? Side effects that you should report to your doctor or health care professional as soon as possible:  allergic reactions like skin rash, itching or hives, swelling of the face, lips, or tongue  breathing problems  fever or sore throat  redness, blistering, peeling or loosening of the skin, including inside the mouth  trouble passing urine or change in the amount of urine  unusual bleeding or bruising  unusually weak or tired  yellowing of the eyes or skin Side effects that usually do not require medical attention (report to your doctor or health care professional if they continue or are bothersome):  headache  nausea, stomach upset This list may not describe all possible side effects. Call your doctor for medical advice about side effects. You may report side effects to FDA at 1-800-FDA-1088. Where should I keep my medicine? Keep out of reach of children. Store at room temperature between 20 and 25 degrees C (68 and 77 degrees F). Protect from moisture and heat. Throw away any unused medicine after the expiration date. NOTE: This sheet is a summary. It may not cover all possible information. If you have questions about this medicine, talk to your doctor, pharmacist, or health  care provider.  2021 Elsevier/Gold Standard (2012-09-08 12:54:16)  Diphenhydramine capsules or tablets What is this medicine? DIPHENHYDRAMINE (dye fen HYE dra meen) is an antihistamine. It is used to treat the symptoms of an allergic reaction. It is also used to treat Parkinson's disease. This medicine is also used to prevent and to treat motion sickness and as a nighttime sleep aid. This medicine may be used for other purposes; ask your health care provider or pharmacist if you have questions. COMMON BRAND NAME(S): Alka-Seltzer Plus Allergy, Aller-G-Time, Banophen, Benadryl Allergy, Benadryl Allergy Dye Free, Benadryl Allergy Kapgel, Benadryl Allergy Ultratab, Diphedryl, Diphenhist, Genahist,  Geri-Dryl, PHARBEDRYL, Q-Dryl, Gretta Began, Valu-Dryl, Vicks ZzzQuil Nightime Sleep-Aid What should I tell my health care provider before I take this medicine? They need to know if you have any of these conditions:  asthma or lung disease  glaucoma  high blood pressure or heart disease  liver disease  pain or difficulty passing urine  prostate trouble  ulcers or other stomach problems  an unusual or allergic reaction to diphenhydramine, other medicines foods, dyes, or preservatives such as sulfites  pregnant or trying to get pregnant  breast-feeding How should I use this medicine? Take this medicine by mouth with a full glass of water. Follow the directions on the prescription label. Take your doses at regular intervals. Do not take your medicine more often than directed. To prevent motion sickness start taking this medicine 30 to 60 minutes before you leave. Talk to your pediatrician regarding the use of this medicine in children. Special care may be needed. Patients over 69 years old may have a stronger reaction and need a smaller dose. Overdosage: If you think you have taken too much of this medicine contact a poison control center or emergency room at once. NOTE: This medicine is only for you. Do not share this medicine with others. What if I miss a dose? If you miss a dose, take it as soon as you can. If it is almost time for your next dose, take only that dose. Do not take double or extra doses. What may interact with this medicine? Do not take this medicine with any of the following medications:  MAOIs like Carbex, Eldepryl, Marplan, Nardil, and Parnate This medicine may also interact with the following medications:  alcohol  barbiturates, like phenobarbital  medicines for bladder spasm like oxybutynin, tolterodine  medicines for blood pressure  medicines for depression, anxiety, or psychotic disturbances  medicines for movement abnormalities or Parkinson's  disease  medicines for sleep  other medicines for cold, cough or allergy  some medicines for the stomach like chlordiazepoxide, dicyclomine This list may not describe all possible interactions. Give your health care provider a list of all the medicines, herbs, non-prescription drugs, or dietary supplements you use. Also tell them if you smoke, drink alcohol, or use illegal drugs. Some items may interact with your medicine. What should I watch for while using this medicine? Visit your doctor or health care professional for regular check ups. Tell your doctor if your symptoms do not improve or if they get worse. Your mouth may get dry. Chewing sugarless gum or sucking hard candy, and drinking plenty of water may help. Contact your doctor if the problem does not go away or is severe. This medicine may cause dry eyes and blurred vision. If you wear contact lenses you may feel some discomfort. Lubricating drops may help. See your eye doctor if the problem does not go away or is severe. You may  get drowsy or dizzy. Do not drive, use machinery, or do anything that needs mental alertness until you know how this medicine affects you. Do not stand or sit up quickly, especially if you are an older patient. This reduces the risk of dizzy or fainting spells. Alcohol may interfere with the effect of this medicine. Avoid alcoholic drinks. What side effects may I notice from receiving this medicine? Side effects that you should report to your doctor or health care professional as soon as possible:  allergic reactions like skin rash, itching or hives, swelling of the face, lips, or tongue  changes in vision  confused, agitated, nervous  irregular or fast heartbeat  tremor  trouble passing urine  unusual bleeding or bruising  unusually weak or tired Side effects that usually do not require medical attention (report to your doctor or health care professional if they continue or are  bothersome):  constipation, diarrhea  drowsy  headache  loss of appetite  stomach upset, vomiting  thick mucous This list may not describe all possible side effects. Call your doctor for medical advice about side effects. You may report side effects to FDA at 1-800-FDA-1088.   Blood Transfusion, Adult, Care After This sheet gives you information about how to care for yourself after your procedure. Your doctor may also give you more specific instructions. If you have problems or questions, contact your doctor. What can I expect after the procedure? After the procedure, it is common to have:  Bruising and soreness at the IV site.  A fever or chills on the day of the procedure. This may be your body's response to the new blood cells received.  A headache. Follow these instructions at home: Insertion site care  Follow instructions from your doctor about how to take care of your insertion site. This is where an IV tube was put into your vein. Make sure you: ? Wash your hands with soap and water before and after you change your bandage (dressing). If you cannot use soap and water, use hand sanitizer. ? Change your bandage as told by your doctor.  Check your insertion site every day for signs of infection. Check for: ? Redness, swelling, or pain. ? Bleeding from the site. ? Warmth. ? Pus or a bad smell.      General instructions  Take over-the-counter and prescription medicines only as told by your doctor.  Rest as told by your doctor.  Go back to your normal activities as told by your doctor.  Keep all follow-up visits as told by your doctor. This is important. Contact a doctor if:  You have itching or red, swollen areas of skin (hives).  You feel worried or nervous (anxious).  You feel weak after doing your normal activities.  You have redness, swelling, warmth, or pain around the insertion site.  You have blood coming from the insertion site, and the blood does  not stop with pressure.  You have pus or a bad smell coming from the insertion site. Get help right away if:  You have signs of a serious reaction. This may be coming from an allergy or the body's defense system (immune system). Signs include: ? Trouble breathing or shortness of breath. ? Swelling of the face or feeling warm (flushed). ? Fever or chills. ? Head, chest, or back pain. ? Dark pee (urine) or blood in the pee. ? Widespread rash. ? Fast heartbeat. ? Feeling dizzy or light-headed. You may receive your blood transfusion in an outpatient  setting. If so, you will be told whom to contact to report any reactions. These symptoms may be an emergency. Do not wait to see if the symptoms will go away. Get medical help right away. Call your local emergency services (911 in the [place] Do not drive yourself to the hospital. Summary  Bruising and soreness at the IV site are common.  Check your insertion site every day for signs of infection.  Rest as told by your doctor. Go back to your normal activities as told by your doctor.  Get help right away if you have signs of a serious reaction. This information is not intended to replace advice given to you by your health care provider. Make sure you discuss any questions you have with your health care provider. Document Revised: 07/10/2018 Document Reviewed: 07/10/2018 Elsevier Patient Education  2021 [place].  Where should I keep my medicine? Keep out of the reach of children. This medicine can be abused. Keep your medicine in a safe place. Store at room temperature between 15 and 30 degrees C (59 and 86 degrees F). Keep container closed tightly. Throw away any unused medicine after the expiration date. NOTE: This sheet is a summary. It may not cover all possible information. If you have questions about this medicine, talk to your doctor, pharmacist, or health care provider.  2021 Elsevier/Gold Standard (2018-10-24 10:18:35)

## 2020-06-23 ENCOUNTER — Encounter: Payer: Self-pay | Admitting: Oncology

## 2020-06-23 LAB — TYPE AND SCREEN
ABO/RH(D): O POS
Antibody Screen: NEGATIVE
Unit division: 0

## 2020-06-23 LAB — BPAM RBC
Blood Product Expiration Date: 202205302359
ISSUE DATE / TIME: 202205250945
Unit Type and Rh: 9500

## 2020-09-16 NOTE — Progress Notes (Signed)
[facility]  [phone] [phone]  ID: Stacy Holloway OB: 03-06-1921  MR#: [id number]  [id number]  Patient Care Team: Idelle Crouch, MD as PCP - General (Internal Medicine) Lloyd Huger, MD as Consulting Physician (Hematology and Oncology)  CHIEF COMPLAINT: CLL, anemia.  INTERVAL HISTORY: Patient returns to clinic today for routine 34-monthevaluation and consideration of additional blood.  She continues to have multiple medical complaints that are all chronic and unchanged.  She reports a fall approximately 1 week ago with some flank bruising and pain.  She has chronic weakness and fatigue.  She has no neurologic complaints.  She denies any fevers or night sweats.  She has a good appetite and denies weight loss.  She denies any chest pain, shortness of breath, cough, or hemoptysis.  She denies any nausea, vomiting, constipation, or diarrhea. She has no urinary complaints.  Patient offers no further specific complaints today.  REVIEW OF SYSTEMS:   Review of Systems  Constitutional:  Positive for malaise/fatigue. Negative for fever and weight loss.  Respiratory: Negative.  Negative for cough and shortness of breath.   Cardiovascular: Negative.  Negative for chest pain and leg swelling.  Gastrointestinal: Negative.  Negative for abdominal pain, constipation and heartburn.  Genitourinary: Negative.  Negative for dysuria.  Musculoskeletal:  Positive for falls. Negative for myalgias.  Skin: Negative.  Negative for rash.  Neurological:  Positive for weakness. Negative for focal weakness and headaches.  Psychiatric/Behavioral: Negative.  The patient is not nervous/anxious.    As per HPI. Otherwise, a complete review of systems is negative.  PAST MEDICAL HISTORY: Past Medical History:  Diagnosis Date   Arthritis    [place]    [place]   Cataract    [place]    Heart disease     Hemorrhoids    Hypertension    Indigestion    Insomnia    Kidney stone    Osteoporosis     PAST SURGICAL HISTORY: Past Surgical History:  Procedure Laterality Date   ABDOMINAL HYSTERECTOMY     APPENDECTOMY     BREAST BIOPSY Left    negative 1982   CHOLECYSTECTOMY     colon cancer     COLON RESECTION     COLONOSCOPY  2008, 2010   TONSILLECTOMY      FAMILY HISTORY: Family History  Problem Relation Age of Onset   Breast cancer Neg Hx     ADVANCED DIRECTIVES (Y/N):  N  HEALTH MAINTENANCE: Social History   Tobacco Use   Smoking status: Former   Smokeless tobacco: Never  VScientific laboratory technicianUse: Never used  Substance Use Topics   Alcohol use: No   Drug use: No     Colonoscopy:  PAP:  Bone density:  Lipid panel:  Allergies  Allergen Reactions   Latex Shortness Of Breath and Other (See Comments)   Other Shortness Of Breath and Other (See Comments)    Dental work on teeth- there was something on gloves    Rituxan [Rituximab] Shortness Of Breath    And itching, nasueated   Sulfa Antibiotics Anaphylaxis    Current Outpatient Medications  Medication Sig Dispense Refill   carvedilol (COREG) 6.25 MG tablet Take 6.25 mg by mouth 2 (two) times daily with a meal.     Cholecalciferol (VITAMIN D3) 25 MCG (1000 UT) CAPS Take 1 capsule by mouth daily.     famotidine (PEPCID) 40 MG tablet  Take by mouth.     pantoprazole (PROTONIX) 40 MG tablet Take 40 mg by mouth daily as needed.     ramipril (ALTACE) 2.5 MG capsule Take 2.5 mg by mouth daily.     SIMETHICONE PO Take by mouth.     vitamin B-12 (CYANOCOBALAMIN) 1000 MCG tablet Take 1,000 mcg by mouth daily.     docusate sodium (COLACE) 100 MG capsule Take 100 mg by mouth daily as needed for mild constipation. (Patient not taking: Reported on 09/20/2020)     furosemide (LASIX) 20 MG tablet Take 20 mg by mouth as needed. (Patient not taking: No sig reported)     No current facility-administered medications for this visit.     OBJECTIVE: Vitals:   09/20/20 0935  BP: (!) 119/48  Pulse: (!) 58  Resp: 18  Temp: 97.9 F (36.6 C)  SpO2: 99%     Body mass index is 25.89 kg/m.    ECOG FS:0 - Asymptomatic  General: Well-developed, well-nourished, no acute distress. Eyes: Pink conjunctiva, anicteric sclera. HEENT: Normocephalic, moist mucous membranes. Lungs: No audible wheezing or coughing. Heart: Regular rate and rhythm. Abdomen: Soft, nontender, no obvious distention. Musculoskeletal: No edema, cyanosis, or clubbing. Neuro: Alert, answering all questions appropriately. Cranial nerves grossly intact. Skin: No rashes or petechiae noted. Psych: Normal affect.   LAB RESULTS:  Lab Results  Component Value Date   NA 139 06/14/2013   K 4.1 06/14/2013   CL 108 (H) 06/14/2013   CO2 28 06/14/2013   GLUCOSE 99 06/14/2013   BUN 26 (H) 06/14/2013   CREATININE 1.07 06/14/2013   CALCIUM 8.6 06/14/2013   PROT 7.4 05/27/2011   ALBUMIN 4.1 05/27/2011   AST 21 05/27/2011   ALT 22 05/27/2011   ALKPHOS 67 05/27/2011   BILITOT 0.9 05/27/2011   GFRNONAA 46 (L) 06/14/2013   GFRAA 53 (L) 06/14/2013    Lab Results  Component Value Date   WBC 61.2 (HH) 09/20/2020   NEUTROABS 1.3 (L) 09/20/2020   HGB 7.2 (L) 09/20/2020   HCT 22.9 (L) 09/20/2020   MCV 109.0 (H) 09/20/2020   PLT 86 (L) 09/20/2020     STUDIES: No results found.  ASSESSMENT: CLL, Rai stage 0  PLAN:   1. CLL: Confirmed by peripheral blood flow cytometry.  Patient received minimal Rituxan prior to having a mild reaction and then treatment was discontinued.  White blood cell count remains elevated, but improved to 61.2.  She continues to decline any further treatments.  Could consider Imbruvica in the future if necessary.  No intervention is needed at this time.  Return to clinic in 3 months with repeat laboratory work and further evaluation.   2.  Thrombocytopenia: Chronic and unchanged.  Platelet count 86 today. 3.  Anemia: Patient's  hemoglobin has trended down to 7.2 and she is mildly symptomatic.  She declined blood transfusion today, but will return to clinic in 1 week to receive 1 unit of packed red blood cells.   4.  Macrocytosis: Chronic and unchanged.  Previously B12 and folate were within normal limits.    I spent a total of 30 minutes reviewing chart data, face-to-face evaluation with the patient, counseling and coordination of care as detailed above.   Patient expressed understanding and was in agreement with this plan. She also understands that She can call clinic at any time with any questions, concerns, or complaints.    Lloyd Huger, MD   09/20/2020 1:09 PM    As needed.

## 2020-09-20 ENCOUNTER — Inpatient Hospital Stay: Payer: Medicare Other | Attending: Oncology

## 2020-09-20 ENCOUNTER — Inpatient Hospital Stay: Payer: Medicare Other

## 2020-09-20 ENCOUNTER — Inpatient Hospital Stay (HOSPITAL_BASED_OUTPATIENT_CLINIC_OR_DEPARTMENT_OTHER): Payer: Medicare Other | Admitting: Oncology

## 2020-09-20 VITALS — BP 119/48 | HR 58 | Temp 97.9°F | Resp 18 | Wt 137.0 lb

## 2020-09-20 DIAGNOSIS — I10 Essential (primary) hypertension: Secondary | ICD-10-CM | POA: Diagnosis not present

## 2020-09-20 DIAGNOSIS — Z79899 Other long term (current) drug therapy: Secondary | ICD-10-CM | POA: Diagnosis not present

## 2020-09-20 DIAGNOSIS — D649 Anemia, unspecified: Secondary | ICD-10-CM | POA: Insufficient documentation

## 2020-09-20 DIAGNOSIS — Z87891 Personal history of nicotine dependence: Secondary | ICD-10-CM | POA: Diagnosis not present

## 2020-09-20 DIAGNOSIS — Z90710 Acquired absence of both cervix and uterus: Secondary | ICD-10-CM | POA: Insufficient documentation

## 2020-09-20 DIAGNOSIS — D696 Thrombocytopenia, unspecified: Secondary | ICD-10-CM | POA: Insufficient documentation

## 2020-09-20 DIAGNOSIS — C9110 Chronic lymphocytic leukemia of B-cell type not having achieved remission: Secondary | ICD-10-CM | POA: Diagnosis present

## 2020-09-20 LAB — CBC WITH DIFFERENTIAL/PLATELET
Abs Immature Granulocytes: 0.03 10*3/uL (ref 0.00–0.07)
Basophils Absolute: 0 10*3/uL (ref 0.0–0.1)
Basophils Relative: 0 %
Eosinophils Absolute: 0 10*3/uL (ref 0.0–0.5)
Eosinophils Relative: 0 %
HCT: 22.9 % — ABNORMAL LOW (ref 36.0–46.0)
Hemoglobin: 7.2 g/dL — ABNORMAL LOW (ref 12.0–15.0)
Immature Granulocytes: 0 %
Lymphocytes Relative: 96 %
Lymphs Abs: 58.8 10*3/uL — ABNORMAL HIGH (ref 0.7–4.0)
MCH: 34.3 pg — ABNORMAL HIGH (ref 26.0–34.0)
MCHC: 31.4 g/dL (ref 30.0–36.0)
MCV: 109 fL — ABNORMAL HIGH (ref 80.0–100.0)
Monocytes Absolute: 1 10*3/uL (ref 0.1–1.0)
Monocytes Relative: 2 %
Neutro Abs: 1.3 10*3/uL — ABNORMAL LOW (ref 1.7–7.7)
Neutrophils Relative %: 2 %
Platelets: 86 10*3/uL — ABNORMAL LOW (ref 150–400)
RBC: 2.1 MIL/uL — ABNORMAL LOW (ref 3.87–5.11)
RDW: 15.8 % — ABNORMAL HIGH (ref 11.5–15.5)
Smear Review: NORMAL
WBC: 61.2 10*3/uL (ref 4.0–10.5)
nRBC: 0 % (ref 0.0–0.2)

## 2020-09-20 LAB — SAMPLE TO BLOOD BANK

## 2020-09-20 NOTE — Progress Notes (Signed)
Patient had a fall last week, she was [facility] at [facility]" she is bruised in her right breast area. She fell out of bed. The doctor there told her to take simethicone for bloating that she inquired about.

## 2020-09-21 ENCOUNTER — Encounter: Payer: Self-pay | Admitting: Oncology

## 2020-09-28 ENCOUNTER — Inpatient Hospital Stay: Payer: Medicare Other

## 2020-09-28 ENCOUNTER — Other Ambulatory Visit: Payer: Self-pay | Admitting: Oncology

## 2020-09-28 DIAGNOSIS — D649 Anemia, unspecified: Secondary | ICD-10-CM | POA: Diagnosis not present

## 2020-09-28 LAB — CBC WITH DIFFERENTIAL/PLATELET
Abs Immature Granulocytes: 0.04 10*3/uL (ref 0.00–0.07)
Basophils Absolute: 0.1 10*3/uL (ref 0.0–0.1)
Basophils Relative: 0 %
Eosinophils Absolute: 0.1 10*3/uL (ref 0.0–0.5)
Eosinophils Relative: 0 %
HCT: 22.2 % — ABNORMAL LOW (ref 36.0–46.0)
Hemoglobin: 6.9 g/dL — ABNORMAL LOW (ref 12.0–15.0)
Immature Granulocytes: 0 %
Lymphocytes Relative: 96 %
Lymphs Abs: 63 10*3/uL — ABNORMAL HIGH (ref 0.7–4.0)
MCH: 34.3 pg — ABNORMAL HIGH (ref 26.0–34.0)
MCHC: 31.1 g/dL (ref 30.0–36.0)
MCV: 110.4 fL — ABNORMAL HIGH (ref 80.0–100.0)
Monocytes Absolute: 1.4 10*3/uL — ABNORMAL HIGH (ref 0.1–1.0)
Monocytes Relative: 2 %
Neutro Abs: 1.2 10*3/uL — ABNORMAL LOW (ref 1.7–7.7)
Neutrophils Relative %: 2 %
Platelets: 90 10*3/uL — ABNORMAL LOW (ref 150–400)
RBC: 2.01 MIL/uL — ABNORMAL LOW (ref 3.87–5.11)
RDW: 15.3 % (ref 11.5–15.5)
Smear Review: NORMAL
WBC: 65.8 10*3/uL (ref 4.0–10.5)
nRBC: 0 % (ref 0.0–0.2)

## 2020-09-28 LAB — PREPARE RBC (CROSSMATCH)

## 2020-09-28 LAB — SAMPLE TO BLOOD BANK

## 2020-09-28 MED ORDER — SODIUM CHLORIDE 0.9% IV SOLUTION
250.0000 mL | Freq: Once | INTRAVENOUS | Status: AC
  Administered 2020-09-28: 250 mL via INTRAVENOUS
  Filled 2020-09-28: qty 250

## 2020-09-28 MED ORDER — DIPHENHYDRAMINE HCL 25 MG PO CAPS
25.0000 mg | ORAL_CAPSULE | Freq: Once | ORAL | Status: AC
  Administered 2020-09-28: 25 mg via ORAL
  Filled 2020-09-28: qty 1

## 2020-09-28 MED ORDER — DIPHENHYDRAMINE HCL 50 MG/ML IJ SOLN: 25.0000 mg | Freq: Once | INTRAMUSCULAR | Status: DC

## 2020-09-28 MED ORDER — ACETAMINOPHEN 325 MG PO TABS
650.0000 mg | ORAL_TABLET | Freq: Once | ORAL | Status: AC
  Administered 2020-09-28: 650 mg via ORAL
  Filled 2020-09-28: qty 2

## 2020-09-28 NOTE — Patient Instructions (Signed)
[facility]  Discharge Instructions: Thank you for choosing [facility] to provide your oncology and hematology care.  If you have a lab appointment with the [facility], please go directly to the [facility] and check in at the registration area.  Wear comfortable clothing and clothing appropriate for easy access to any Portacath or PICC line.   We strive to give you quality time with your provider. You may need to reschedule your appointment if you arrive late (15 or more minutes).  Arriving late affects you and other patients whose appointments are after yours.  Also, if you miss three or more appointments without notifying the office, you may be dismissed from the clinic at the provider's discretion.      For prescription refill requests, have your pharmacy contact our office and allow 72 hours for refills to be completed.    Today you received the following chemotherapy and/or immunotherapy agents BLOOD TRANSFUSION       To help prevent nausea and vomiting after your treatment, we encourage you to take your nausea medication as directed.  BELOW ARE SYMPTOMS THAT SHOULD BE REPORTED IMMEDIATELY: *FEVER GREATER THAN 100.4 F (38 C) OR HIGHER *CHILLS OR SWEATING *NAUSEA AND VOMITING THAT IS NOT CONTROLLED WITH YOUR NAUSEA MEDICATION *UNUSUAL SHORTNESS OF BREATH *UNUSUAL BRUISING OR BLEEDING *URINARY PROBLEMS (pain or burning when urinating, or frequent urination) *BOWEL PROBLEMS (unusual diarrhea, constipation, pain near the anus) TENDERNESS IN MOUTH AND THROAT WITH OR WITHOUT PRESENCE OF ULCERS (sore throat, sores in mouth, or a toothache) UNUSUAL RASH, SWELLING OR PAIN  UNUSUAL VAGINAL DISCHARGE OR ITCHING   Items with * indicate a potential emergency and should be followed up as soon as possible or go to the Emergency Department if any problems should occur.  Please show the CHEMOTHERAPY ALERT CARD or IMMUNOTHERAPY ALERT CARD at  check-in to the Emergency Department and triage nurse.  Should you have questions after your visit or need to cancel or reschedule your appointment, please contact [facility]  [phone] and follow the prompts.  Office hours are 8:00 a.m. to 4:30 p.m. Monday - Friday. Please note that voicemails left after 4:00 p.m. may not be returned until the following business day.  We are closed weekends and major holidays. You have access to a nurse at all times for urgent questions. Please call the main number to the clinic [phone] and follow the prompts.  For any non-urgent questions, you may also contact your provider using MyChart. We now offer e-Visits for anyone 74 and older to request care online for non-urgent symptoms. For details visit [email or web address].   Also download the MyChart app! Go to the app store, search "MyChart", open the app, select [facility], and log in with your MyChart username and password.  Due to Covid, a mask is required upon entering the hospital/clinic. If you do not have a mask, one will be given to you upon arrival. For doctor visits, patients may have 1 support person aged 74 or older with them. For treatment visits, patients cannot have anyone with them due to current Covid guidelines and our immunocompromised population.   Blood Transfusion, Adult, Care After This sheet gives you information about how to care for yourself after your procedure. Your doctor may also give you more specific instructions. If you have problems or questions, contact your doctor. What can I expect after the procedure? After the procedure, it is common to have: Bruising  and soreness at the IV site. A headache. Follow these instructions at home: Insertion site care   Follow instructions from your doctor about how to take care of your insertion site. This is where an IV tube was put into your vein. Make sure you: Wash your hands with soap and  water before and after you change your bandage (dressing). If you cannot use soap and water, use hand sanitizer. Change your bandage as told by your doctor. Check your insertion site every day for signs of infection. Check for: Redness, swelling, or pain. Bleeding from the site. Warmth. Pus or a bad smell. General instructions Take over-the-counter and prescription medicines only as told by your doctor. Rest as told by your doctor. Go back to your normal activities as told by your doctor. Keep all follow-up visits as told by your doctor. This is important. Contact a doctor if: You have itching or red, swollen areas of skin (hives). You feel worried or nervous (anxious). You feel weak after doing your normal activities. You have redness, swelling, warmth, or pain around the insertion site. You have blood coming from the insertion site, and the blood does not stop with pressure. You have pus or a bad smell coming from the insertion site. Get help right away if: You have signs of a serious reaction. This may be coming from an allergy or the body's defense system (immune system). Signs include: Trouble breathing or shortness of breath. Swelling of the face or feeling warm (flushed). Fever or chills. Head, chest, or back pain. Dark pee (urine) or blood in the pee. Widespread rash. Fast heartbeat. Feeling dizzy or light-headed. You may receive your blood transfusion in an outpatient setting. If so, you will be told whom to contact to report any reactions. These symptoms may be an emergency. Do not wait to see if the symptoms will go away. Get medical help right away. Call your local emergency services (911 in the [place] Do not drive yourself to the hospital. Summary Bruising and soreness at the IV site are common. Check your insertion site every day for signs of infection. Rest as told by your doctor. Go back to your normal activities as told by your doctor. Get help right away if you  have signs of a serious reaction. This information is not intended to replace advice given to you by your health care provider. Make sure you discuss any questions you have with your health care provider. Document Revised: 05/12/2020 Document Reviewed: 07/10/2018 Elsevier Patient Education  [place].

## 2020-09-29 LAB — TYPE AND SCREEN
ABO/RH(D): O POS
Antibody Screen: NEGATIVE
Unit division: 0
Unit division: 0

## 2020-09-29 LAB — BPAM RBC
Blood Product Expiration Date: 202209142359
Blood Product Expiration Date: 202209142359
ISSUE DATE / TIME: 202208311117
ISSUE DATE / TIME: 202208311334
Unit Type and Rh: 5100
Unit Type and Rh: 5100

## 2020-10-06 ENCOUNTER — Encounter: Payer: Self-pay | Admitting: Oncology

## 2020-12-27 ENCOUNTER — Inpatient Hospital Stay: Payer: Medicare Other | Attending: Oncology | Admitting: Nurse Practitioner

## 2020-12-27 ENCOUNTER — Inpatient Hospital Stay: Payer: Medicare Other

## 2020-12-27 ENCOUNTER — Encounter: Payer: Self-pay | Admitting: Nurse Practitioner

## 2020-12-27 ENCOUNTER — Other Ambulatory Visit: Payer: Self-pay

## 2020-12-27 VITALS — BP 137/38 | HR 57 | Temp 98.7°F | Resp 20 | Wt 134.6 lb

## 2020-12-27 DIAGNOSIS — Z7189 Other specified counseling: Secondary | ICD-10-CM

## 2020-12-27 DIAGNOSIS — D649 Anemia, unspecified: Secondary | ICD-10-CM | POA: Insufficient documentation

## 2020-12-27 DIAGNOSIS — D7589 Other specified diseases of blood and blood-forming organs: Secondary | ICD-10-CM | POA: Insufficient documentation

## 2020-12-27 DIAGNOSIS — D696 Thrombocytopenia, unspecified: Secondary | ICD-10-CM | POA: Insufficient documentation

## 2020-12-27 DIAGNOSIS — I10 Essential (primary) hypertension: Secondary | ICD-10-CM | POA: Diagnosis not present

## 2020-12-27 DIAGNOSIS — C9110 Chronic lymphocytic leukemia of B-cell type not having achieved remission: Secondary | ICD-10-CM | POA: Insufficient documentation

## 2020-12-27 DIAGNOSIS — Z87891 Personal history of nicotine dependence: Secondary | ICD-10-CM | POA: Diagnosis not present

## 2020-12-27 LAB — CBC WITH DIFFERENTIAL/PLATELET
Abs Immature Granulocytes: 0.03 10*3/uL (ref 0.00–0.07)
Basophils Absolute: 0 10*3/uL (ref 0.0–0.1)
Basophils Relative: 0 %
Eosinophils Absolute: 0 10*3/uL (ref 0.0–0.5)
Eosinophils Relative: 0 %
HCT: 24.4 % — ABNORMAL LOW (ref 36.0–46.0)
Hemoglobin: 7.6 g/dL — ABNORMAL LOW (ref 12.0–15.0)
Immature Granulocytes: 0 %
Lymphocytes Relative: 96 %
Lymphs Abs: 52 10*3/uL — ABNORMAL HIGH (ref 0.7–4.0)
MCH: 34.1 pg — ABNORMAL HIGH (ref 26.0–34.0)
MCHC: 31.1 g/dL (ref 30.0–36.0)
MCV: 109.4 fL — ABNORMAL HIGH (ref 80.0–100.0)
Monocytes Absolute: 0.4 10*3/uL (ref 0.1–1.0)
Monocytes Relative: 1 %
Neutro Abs: 1.4 10*3/uL — ABNORMAL LOW (ref 1.7–7.7)
Neutrophils Relative %: 3 %
Platelets: 79 10*3/uL — ABNORMAL LOW (ref 150–400)
RBC: 2.23 MIL/uL — ABNORMAL LOW (ref 3.87–5.11)
RDW: 15.9 % — ABNORMAL HIGH (ref 11.5–15.5)
Smear Review: NORMAL
WBC: 53.9 10*3/uL (ref 4.0–10.5)
nRBC: 0 % (ref 0.0–0.2)

## 2020-12-27 LAB — SAMPLE TO BLOOD BANK

## 2020-12-27 NOTE — Progress Notes (Signed)
[facility]  [phone] [phone]) [phone]  ID: AEDYN MCKEON OB: 11-Sep-1921  MR#: [id number]  HRC#:[id number]  Patient Care Team: Idelle Crouch, MD as PCP - General (Internal Medicine) Lloyd Huger, MD as Consulting Physician (Hematology and Oncology)  CHIEF COMPLAINT: CLL, anemia.  INTERVAL HISTORY: Patient is a 87 year old female with history of CLL who returns to clinic for labs and further evaluation.  Patient previously received minimal Rituxan prior to having mild reaction and elected to discontinue treatment.  She has declined any further treatment and elects for supportive care.  She receives blood transfusions intermittently.  Last received blood in August 2022.  Prefers to avoid medications when possible.  Continues to drive.  Lives at home alone.  Healthcare power of attorney is her daughter Manuela Schwartz who does not live locally.  She has chronic cramping of the legs, weakness, fatigue.  REVIEW OF SYSTEMS:   Review of Systems  Constitutional:  Positive for malaise/fatigue. Negative for fever and weight loss.  Respiratory: Negative.  Negative for cough and shortness of breath.   Cardiovascular: Negative.  Negative for chest pain and leg swelling.  Gastrointestinal: Negative.  Negative for abdominal pain, constipation and heartburn.  Genitourinary: Negative.  Negative for dysuria.  Musculoskeletal:  Negative for falls and myalgias.  Skin: Negative.  Negative for rash.  Neurological:  Positive for weakness. Negative for focal weakness and headaches.  Psychiatric/Behavioral: Negative.  The patient is not nervous/anxious.   As per HPI. Otherwise, a complete review of systems is negative.  PAST MEDICAL HISTORY: Past Medical History:  Diagnosis Date   Arthritis    Cancer ([place])    colon cancer 2009   Cataract    COPD (chronic obstructive pulmonary disease) ([place])    Heart disease    Hemorrhoids    Hypertension    Indigestion    Insomnia     Kidney stone    Osteoporosis     PAST SURGICAL HISTORY: Past Surgical History:  Procedure Laterality Date   ABDOMINAL HYSTERECTOMY     APPENDECTOMY     BREAST BIOPSY Left    negative 1982   CHOLECYSTECTOMY     colon cancer     COLON RESECTION     COLONOSCOPY  2008, 2010   TONSILLECTOMY      FAMILY HISTORY: Family History  Problem Relation Age of Onset   Breast cancer Neg Hx     ADVANCED DIRECTIVES (Y/N):  N  HEALTH MAINTENANCE: Social History   Tobacco Use   Smoking status: Former   Smokeless tobacco: Never  Scientific laboratory technician Use: Never used  Substance Use Topics   Alcohol use: No   Drug use: No     Colonoscopy:  PAP:  Bone density:  Lipid panel:  Allergies  Allergen Reactions   Latex Shortness Of Breath and Other (See Comments)   Other Shortness Of Breath and Other (See Comments)    Dental work on teeth- there was something on gloves    Rituxan [Rituximab] Shortness Of Breath    And itching, nasueated   Sulfa Antibiotics Anaphylaxis    Current Outpatient Medications  Medication Sig Dispense Refill   carvedilol (COREG) 6.25 MG tablet Take 6.25 mg by mouth 2 (two) times daily with a meal.     Cholecalciferol (VITAMIN D3) 25 MCG (1000 UT) CAPS Take 1 capsule by mouth daily.     famotidine (PEPCID) 40 MG tablet Take by mouth.     pantoprazole (PROTONIX) 40 MG  tablet Take 40 mg by mouth daily as needed.     ramipril (ALTACE) 2.5 MG capsule Take 2.5 mg by mouth daily.     SIMETHICONE PO Take by mouth.     vitamin B-12 (CYANOCOBALAMIN) 1000 MCG tablet Take 1,000 mcg by mouth daily.     docusate sodium (COLACE) 100 MG capsule Take 100 mg by mouth daily as needed for mild constipation. (Patient not taking: Reported on 09/20/2020)     furosemide (LASIX) 20 MG tablet Take 20 mg by mouth as needed. (Patient not taking: Reported on 03/21/2020)     No current facility-administered medications for this visit.    OBJECTIVE: Vitals:   12/27/20 1446  BP:  (!) 137/38  Pulse: (!) 57  Resp: 20  Temp: 98.7 F (37.1 C)  SpO2: 100%  Body mass index is 25.43 kg/m.     ECOG FS:0 - Asymptomatic  General: Well-developed, well-nourished, no acute distress. Eyes: Pink conjunctiva, anicteric sclera. Lungs: Clear to auscultation bilaterally.  No audible wheezing or coughing Heart: Regular rate and rhythm.  Abdomen: Soft, nontender, nondistended.  Musculoskeletal: No edema, cyanosis, or clubbing. Neuro: Alert, answering all questions appropriately. Cranial nerves grossly intact. Skin: No rashes or petechiae noted. Psych: Normal affect.  LAB RESULTS:  Lab Results  Component Value Date   NA 139 06/14/2013   K 4.1 06/14/2013   CL 108 (H) 06/14/2013   CO2 28 06/14/2013   GLUCOSE 99 06/14/2013   BUN 26 (H) 06/14/2013   CREATININE 1.07 06/14/2013   CALCIUM 8.6 06/14/2013   PROT 7.4 05/27/2011   ALBUMIN 4.1 05/27/2011   AST 21 05/27/2011   ALT 22 05/27/2011   ALKPHOS 67 05/27/2011   BILITOT 0.9 05/27/2011   GFRNONAA 46 (L) 06/14/2013   GFRAA 53 (L) 06/14/2013    Lab Results  Component Value Date   WBC 65.8 (HH) 09/28/2020   NEUTROABS 1.2 (L) 09/28/2020   HGB 6.9 (L) 09/28/2020   HCT 22.2 (L) 09/28/2020   MCV 110.4 (H) 09/28/2020   PLT 90 (L) 09/28/2020     STUDIES: No results found.  ASSESSMENT: CLL, Rai stage 0  PLAN:   1. CLL: Confirmed by peripheral blood flow cytometry.  Patient received minimal Rituxan prior to having a mild reaction and then treatment was discontinued.  White blood cell count remains elevated, but improved to 53.9. Reviewed imbruvica but patient declines additional treatments and prefers supportive care.  No intervention is needed at this time.  Return to clinic in 3 months with repeat laboratory work and further evaluation.    2.  Thrombocytopenia: Chronic and unchanged. Platelet count 79 today.  3.  Anemia: Hemoglobin is roughly stable and she is symptomatic. Question if leg cramps improve with blood  transfusion. Hemoglobin 7.6 today. Will plan for blood 2 days from now per her request as she becomes too fatigued with lengthy appointments. She would prefer to avoid tylenol and benadryl as pre-meds d/t side effects.   4.  Macrocytosis: Chronic and unchanged.  Previously B12 and folate were within normal limits.    5. Goals of care- Patient prefers to have shorter appointments when possible due to her becoming easily fatigued. Therefore, we can consider labs and virtual or in person visits then +/- blood 2 days later. She prefers to not get more than 1 unit of blood at a time due to length of visit and increased fatigue. Could also consider referral to palliative care to help evaluate her goals. Could also consider community  based services.   RTC on 12/1 for blood transfusion as scheduled RTC monthly for lab & virtual or in person visit then possible blood transfusion 2 days later.   I spent a total of 45 minutes reviewing chart data, face-to-face (35 minutes) evaluation with the patient, counseling and coordination of care as detailed above.  Patient expressed understanding and was in agreement with this plan. She also understands that She can call clinic at any time with any questions, concerns, or complaints.   Verlon Au, NP   12/27/2020

## 2020-12-29 ENCOUNTER — Ambulatory Visit: Payer: Medicare Other | Admitting: Nurse Practitioner

## 2020-12-29 ENCOUNTER — Inpatient Hospital Stay: Payer: Medicare Other | Attending: Oncology

## 2020-12-29 ENCOUNTER — Other Ambulatory Visit: Payer: Self-pay

## 2020-12-29 ENCOUNTER — Other Ambulatory Visit: Payer: Medicare Other

## 2020-12-29 DIAGNOSIS — C9110 Chronic lymphocytic leukemia of B-cell type not having achieved remission: Secondary | ICD-10-CM | POA: Diagnosis present

## 2020-12-29 DIAGNOSIS — I10 Essential (primary) hypertension: Secondary | ICD-10-CM | POA: Insufficient documentation

## 2020-12-29 DIAGNOSIS — D696 Thrombocytopenia, unspecified: Secondary | ICD-10-CM | POA: Diagnosis not present

## 2020-12-29 DIAGNOSIS — D7589 Other specified diseases of blood and blood-forming organs: Secondary | ICD-10-CM | POA: Insufficient documentation

## 2020-12-29 DIAGNOSIS — D649 Anemia, unspecified: Secondary | ICD-10-CM | POA: Insufficient documentation

## 2020-12-29 LAB — PREPARE RBC (CROSSMATCH)

## 2020-12-29 MED ORDER — SODIUM CHLORIDE 0.9% IV SOLUTION
250.0000 mL | Freq: Once | INTRAVENOUS | Status: AC
  Administered 2020-12-29: 250 mL via INTRAVENOUS
  Filled 2020-12-29: qty 250

## 2020-12-29 NOTE — Patient Instructions (Signed)
Blood Transfusion, Adult, Care After This sheet gives you information about how to care for yourself after your procedure. Your doctor may also give you more specific instructions. If you have problems or questions, contact your doctor. What can I expect after the procedure? After the procedure, it is common to have: Bruising and soreness at the IV site. A headache. Follow these instructions at home: Insertion site care   Follow instructions from your doctor about how to take care of your insertion site. This is where an IV tube was put into your vein. Make sure you: Wash your hands with soap and water before and after you change your bandage (dressing). If you cannot use soap and water, use hand sanitizer. Change your bandage as told by your doctor. Check your insertion site every day for signs of infection. Check for: Redness, swelling, or pain. Bleeding from the site. Warmth. Pus or a bad smell. General instructions Take over-the-counter and prescription medicines only as told by your doctor. Rest as told by your doctor. Go back to your normal activities as told by your doctor. Keep all follow-up visits as told by your doctor. This is important. Contact a doctor if: You have itching or red, swollen areas of skin (hives). You feel worried or nervous (anxious). You feel weak after doing your normal activities. You have redness, swelling, warmth, or pain around the insertion site. You have blood coming from the insertion site, and the blood does not stop with pressure. You have pus or a bad smell coming from the insertion site. Get help right away if: You have signs of a serious reaction. This may be coming from an allergy or the body's defense system (immune system). Signs include: Trouble breathing or shortness of breath. Swelling of the face or feeling warm (flushed). Fever or chills. Head, chest, or back pain. Dark pee (urine) or blood in the pee. Widespread rash. Fast  heartbeat. Feeling dizzy or light-headed. You may receive your blood transfusion in an outpatient setting. If so, you will be told whom to contact to report any reactions. These symptoms may be an emergency. Do not wait to see if the symptoms will go away. Get medical help right away. Call your local emergency services (911 in the [place] Do not drive yourself to the hospital. Summary Bruising and soreness at the IV site are common. Check your insertion site every day for signs of infection. Rest as told by your doctor. Go back to your normal activities as told by your doctor. Get help right away if you have signs of a serious reaction. This information is not intended to replace advice given to you by your health care provider. Make sure you discuss any questions you have with your health care provider. Document Revised: 05/12/2020 Document Reviewed: 07/10/2018 Elsevier Patient Education  2022 Elsevier Inc.  

## 2020-12-30 ENCOUNTER — Ambulatory Visit: Payer: Medicare Other

## 2020-12-30 ENCOUNTER — Ambulatory Visit: Payer: Medicare Other | Admitting: Oncology

## 2020-12-30 ENCOUNTER — Other Ambulatory Visit: Payer: Medicare Other

## 2020-12-30 LAB — BPAM RBC
Blood Product Expiration Date: 202212192359
ISSUE DATE / TIME: 202212010924
Unit Type and Rh: 5100

## 2020-12-30 LAB — TYPE AND SCREEN
ABO/RH(D): O POS
Antibody Screen: NEGATIVE
Unit division: 0

## 2021-01-23 NOTE — Progress Notes (Signed)
[facility]  [phone]) [phone] Fax:(336) [phone]  ID: Stacy Holloway OB: 1921-02-08  MR#: [id number]  DTO#:[id number]  Patient Care Team: Idelle Crouch, MD as PCP - General (Internal Medicine) Lloyd Huger, MD as Consulting Physician (Hematology and Oncology)  CHIEF COMPLAINT: CLL, anemia.  INTERVAL HISTORY: Patient returns to clinic today for routine 39-month evaluation and consideration of additional blood.  She has chronic weakness and fatigue, but otherwise feels well.  She continues to be active and live by herself.  She has no neurologic complaints.  She denies any fevers or night sweats.  She has a good appetite, but admits to mild weight loss.  She denies any chest pain, shortness of breath, cough, or hemoptysis.  She denies any nausea, vomiting, constipation, or diarrhea. She has no urinary complaints.  Patient offers no further specific complaints today.  REVIEW OF SYSTEMS:   Review of Systems  Constitutional:  Positive for malaise/fatigue and weight loss. Negative for fever.  Respiratory: Negative.  Negative for cough and shortness of breath.   Cardiovascular: Negative.  Negative for chest pain and leg swelling.  Gastrointestinal: Negative.  Negative for abdominal pain, constipation and heartburn.  Genitourinary: Negative.  Negative for dysuria.  Musculoskeletal: Negative.  Negative for falls and myalgias.  Skin: Negative.  Negative for rash.  Neurological:  Positive for weakness. Negative for focal weakness and headaches.  Psychiatric/Behavioral: Negative.  The patient is not nervous/anxious.    As per HPI. Otherwise, a complete review of systems is negative.  PAST MEDICAL HISTORY: Past Medical History:  Diagnosis Date   Arthritis    Cancer ([place] [facility])    colon cancer 2009   Cataract    COPD (chronic obstructive pulmonary disease) ([place])    Heart disease    Hemorrhoids    Hypertension    Indigestion    Insomnia    Kidney stone     Osteoporosis     PAST SURGICAL HISTORY: Past Surgical History:  Procedure Laterality Date   ABDOMINAL HYSTERECTOMY     APPENDECTOMY     BREAST BIOPSY Left    negative 1982   CHOLECYSTECTOMY     colon cancer     COLON RESECTION     COLONOSCOPY  2008, 2010   TONSILLECTOMY      FAMILY HISTORY: Family History  Problem Relation Age of Onset   Breast cancer Neg Hx     ADVANCED DIRECTIVES (Y/N):  N  HEALTH MAINTENANCE: Social History   Tobacco Use   Smoking status: Former   Smokeless tobacco: Never  Scientific laboratory technician Use: Never used  Substance Use Topics   Alcohol use: No   Drug use: No     Colonoscopy:  PAP:  Bone density:  Lipid panel:  Allergies  Allergen Reactions   Latex Shortness Of Breath and Other (See Comments)   Other Shortness Of Breath and Other (See Comments)    Dental work on teeth- there was something on gloves    Rituxan [Rituximab] Shortness Of Breath    And itching, nasueated   Sulfa Antibiotics Anaphylaxis    Current Outpatient Medications  Medication Sig Dispense Refill   carvedilol (COREG) 6.25 MG tablet Take 6.25 mg by mouth 2 (two) times daily with a meal.     Cholecalciferol (VITAMIN D3) 25 MCG (1000 UT) CAPS Take 1 capsule by mouth daily.     ramipril (ALTACE) 2.5 MG capsule Take 2.5 mg by mouth daily.     vitamin B-12 (CYANOCOBALAMIN)  1000 MCG tablet Take 1,000 mcg by mouth daily.     docusate sodium (COLACE) 100 MG capsule Take 100 mg by mouth daily as needed for mild constipation. (Patient not taking: Reported on 09/20/2020)     famotidine (PEPCID) 40 MG tablet Take by mouth.     furosemide (LASIX) 20 MG tablet Take 20 mg by mouth as needed. (Patient not taking: Reported on 03/21/2020)     pantoprazole (PROTONIX) 40 MG tablet Take 40 mg by mouth daily as needed. (Patient not taking: Reported on 01/26/2021)     SIMETHICONE PO Take by mouth. (Patient not taking: Reported on 01/26/2021)     No current facility-administered  medications for this visit.   Facility-Administered Medications Ordered in Other Visits  Medication Dose Route Frequency Provider Last Rate Last Admin   acetaminophen (TYLENOL) tablet 650 mg  650 mg Oral Once Lloyd Huger, MD       diphenhydrAMINE (BENADRYL) injection 25 mg  25 mg Intravenous Once Lloyd Huger, MD        OBJECTIVE: Vitals:   01/26/21 0931  BP: (!) 142/63  Pulse: 73  Resp: 16  Temp: (!) 97.1 F (36.2 C)  SpO2: 99%     Body mass index is 24 kg/m.    ECOG FS:0 - Asymptomatic  General: Well-developed, well-nourished, no acute distress. Eyes: Pink conjunctiva, anicteric sclera. HEENT: Normocephalic, moist mucous membranes. Lungs: No audible wheezing or coughing. Heart: Regular rate and rhythm. Abdomen: Soft, nontender, no obvious distention. Musculoskeletal: No edema, cyanosis, or clubbing. Neuro: Alert, answering all questions appropriately. Cranial nerves grossly intact. Skin: No rashes or petechiae noted. Psych: Normal affect.   LAB RESULTS:  Lab Results  Component Value Date   NA 139 06/14/2013   K 4.1 06/14/2013   CL 108 (H) 06/14/2013   CO2 28 06/14/2013   GLUCOSE 99 06/14/2013   BUN 26 (H) 06/14/2013   CREATININE 1.07 06/14/2013   CALCIUM 8.6 06/14/2013   PROT 7.4 05/27/2011   ALBUMIN 4.1 05/27/2011   AST 21 05/27/2011   ALT 22 05/27/2011   ALKPHOS 67 05/27/2011   BILITOT 0.9 05/27/2011   GFRNONAA 46 (L) 06/14/2013   GFRAA 53 (L) 06/14/2013    Lab Results  Component Value Date   WBC 49.3 (H) 01/25/2021   NEUTROABS 1.6 (L) 01/25/2021   HGB 8.5 (L) 01/25/2021   HCT 26.5 (L) 01/25/2021   MCV 106.4 (H) 01/25/2021   PLT 85 (L) 01/25/2021     STUDIES: No results found.  ASSESSMENT: CLL, Rai stage 0  PLAN:   1. CLL: Confirmed by peripheral blood flow cytometry.  Patient received minimal Rituxan prior to having a mild reaction and then treatment was discontinued.  Her white blood cell count remains elevated, but slightly  improved to 49.3. She continues to decline any further treatments.  Could consider Imbruvica in the future if necessary.  No intervention is needed at this time.  Return to clinic in 3 months with repeat laboratory work and further evaluation. 2.  Thrombocytopenia: Chronic and unchanged.  Patient's platelet count is 85 today. 3.  Anemia: Although patient's hemoglobin has improved to 8.5, she remains symptomatic therefore we will proceed with 1 unit packed red blood cells.  Return to clinic in 3 months as above. 4.  Macrocytosis: Chronic and unchanged.  Previously B12 and folate were within normal limits.    I spent a total of 30 minutes reviewing chart data, face-to-face evaluation with the patient, counseling and coordination of  care as detailed above.   Patient expressed understanding and was in agreement with this plan. She also understands that She can call clinic at any time with any questions, concerns, or complaints.    Lloyd Huger, MD   01/26/2021 10:26 AM    As needed.

## 2021-01-25 ENCOUNTER — Other Ambulatory Visit: Payer: Self-pay | Admitting: Oncology

## 2021-01-25 ENCOUNTER — Other Ambulatory Visit: Payer: Self-pay

## 2021-01-25 ENCOUNTER — Inpatient Hospital Stay: Payer: Medicare Other

## 2021-01-25 DIAGNOSIS — C9110 Chronic lymphocytic leukemia of B-cell type not having achieved remission: Secondary | ICD-10-CM | POA: Diagnosis not present

## 2021-01-25 DIAGNOSIS — D649 Anemia, unspecified: Secondary | ICD-10-CM

## 2021-01-25 LAB — CBC WITH DIFFERENTIAL/PLATELET
Abs Immature Granulocytes: 0.03 10*3/uL (ref 0.00–0.07)
Basophils Absolute: 0 10*3/uL (ref 0.0–0.1)
Basophils Relative: 0 %
Eosinophils Absolute: 0 10*3/uL (ref 0.0–0.5)
Eosinophils Relative: 0 %
HCT: 26.5 % — ABNORMAL LOW (ref 36.0–46.0)
Hemoglobin: 8.5 g/dL — ABNORMAL LOW (ref 12.0–15.0)
Immature Granulocytes: 0 %
Lymphocytes Relative: 96 %
Lymphs Abs: 47.3 10*3/uL — ABNORMAL HIGH (ref 0.7–4.0)
MCH: 34.1 pg — ABNORMAL HIGH (ref 26.0–34.0)
MCHC: 32.1 g/dL (ref 30.0–36.0)
MCV: 106.4 fL — ABNORMAL HIGH (ref 80.0–100.0)
Monocytes Absolute: 0.3 10*3/uL (ref 0.1–1.0)
Monocytes Relative: 1 %
Neutro Abs: 1.6 10*3/uL — ABNORMAL LOW (ref 1.7–7.7)
Neutrophils Relative %: 3 %
Platelets: 85 10*3/uL — ABNORMAL LOW (ref 150–400)
RBC: 2.49 MIL/uL — ABNORMAL LOW (ref 3.87–5.11)
RDW: 14.7 % (ref 11.5–15.5)
Smear Review: NORMAL
WBC: 49.3 10*3/uL — ABNORMAL HIGH (ref 4.0–10.5)
nRBC: 0 % (ref 0.0–0.2)

## 2021-01-25 LAB — SAMPLE TO BLOOD BANK

## 2021-01-25 LAB — PREPARE RBC (CROSSMATCH)

## 2021-01-26 ENCOUNTER — Inpatient Hospital Stay: Payer: Medicare Other

## 2021-01-26 ENCOUNTER — Inpatient Hospital Stay (HOSPITAL_BASED_OUTPATIENT_CLINIC_OR_DEPARTMENT_OTHER): Payer: Medicare Other | Admitting: Oncology

## 2021-01-26 VITALS — BP 142/63 | HR 73 | Temp 97.1°F | Resp 16 | Wt 127.0 lb

## 2021-01-26 DIAGNOSIS — D649 Anemia, unspecified: Secondary | ICD-10-CM

## 2021-01-26 DIAGNOSIS — C9110 Chronic lymphocytic leukemia of B-cell type not having achieved remission: Secondary | ICD-10-CM

## 2021-01-26 MED ORDER — ACETAMINOPHEN 325 MG PO TABS: 650.0000 mg | ORAL_TABLET | Freq: Once | ORAL | Status: DC

## 2021-01-26 MED ORDER — SODIUM CHLORIDE 0.9% IV SOLUTION
250.0000 mL | Freq: Once | INTRAVENOUS | Status: AC
  Administered 2021-01-26: 10:00:00 250 mL via INTRAVENOUS
  Filled 2021-01-26: qty 250

## 2021-01-26 MED ORDER — DIPHENHYDRAMINE HCL 50 MG/ML IJ SOLN: 25.0000 mg | Freq: Once | INTRAMUSCULAR | Status: DC

## 2021-01-26 NOTE — Progress Notes (Signed)
Pt c/o fatigue and some weakness but denies shortness of breath.

## 2021-01-26 NOTE — Patient Instructions (Signed)
[facility] AT [place]   Discharge Instructions: Thank you for choosing [facility] to provide your oncology and hematology care.  If you have a lab appointment with the [facility], please go directly to the [facility] and check in at the registration area.   We strive to give you quality time with your provider. You may need to reschedule your appointment if you arrive late (15 or more minutes).  Arriving late affects you and other patients whose appointments are after yours.  Also, if you miss three or more appointments without notifying the office, you may be dismissed from the clinic at the providers discretion.      For prescription refill requests, have your pharmacy contact our office and allow 72 hours for refills to be completed.    Today you received the following: Blood transfusion.      To help prevent nausea and vomiting after your treatment, we encourage you to take your nausea medication as directed.  BELOW ARE SYMPTOMS THAT SHOULD BE REPORTED IMMEDIATELY: *FEVER GREATER THAN 100.4 F (38 C) OR HIGHER *CHILLS OR SWEATING *NAUSEA AND VOMITING THAT IS NOT CONTROLLED WITH YOUR NAUSEA MEDICATION *UNUSUAL SHORTNESS OF BREATH *UNUSUAL BRUISING OR BLEEDING *URINARY PROBLEMS (pain or burning when urinating, or frequent urination) *BOWEL PROBLEMS (unusual diarrhea, constipation, pain near the anus) TENDERNESS IN MOUTH AND THROAT WITH OR WITHOUT PRESENCE OF ULCERS (sore throat, sores in mouth, or a toothache) UNUSUAL RASH, SWELLING OR PAIN  UNUSUAL VAGINAL DISCHARGE OR ITCHING   Items with * indicate a potential emergency and should be followed up as soon as possible or go to the Emergency Department if any problems should occur.  Please show the CHEMOTHERAPY ALERT CARD or IMMUNOTHERAPY ALERT CARD at check-in to the Emergency Department and triage nurse.  Should you have questions after your visit or need to cancel or reschedule your  appointment, please contact [facility] [place]  Dept: [phone]  and follow the prompts.  Office hours are 8:00 a.m. to 4:30 p.m. Monday - Friday. Please note that voicemails left after 4:00 p.m. may not be returned until the following business day.  We are closed weekends and major holidays. You have access to a nurse at all times for urgent questions. Please call the main number to the clinic Dept: [phone] and follow the prompts.  For any non-urgent questions, you may also contact your provider using MyChart. We now offer e-Visits for anyone 23 and older to request care online for non-urgent symptoms. For details visit [email or web address].   Also download the MyChart app! Go to the app store, search "MyChart", open the app, select [facility], and log in with your MyChart username and password.  Due to Covid, a mask is required upon entering the hospital/clinic. If you do not have a mask, one will be given to you upon arrival. For doctor visits, patients may have 1 support person aged 67 or older with them. For treatment visits, patients cannot have anyone with them due to current Covid guidelines and our immunocompromised population.

## 2021-01-27 LAB — TYPE AND SCREEN
ABO/RH(D): O POS
Antibody Screen: NEGATIVE
Unit division: 0
Unit division: 0

## 2021-01-27 LAB — BPAM RBC
Blood Product Expiration Date: 202301022359
Blood Product Expiration Date: 202301062359
ISSUE DATE / TIME: 202212291029
ISSUE DATE / TIME: 202212300925
Unit Type and Rh: 5100
Unit Type and Rh: 5100

## 2021-04-26 ENCOUNTER — Inpatient Hospital Stay: Payer: Medicare Other | Attending: Oncology

## 2021-04-26 ENCOUNTER — Other Ambulatory Visit: Payer: Self-pay

## 2021-04-26 DIAGNOSIS — R197 Diarrhea, unspecified: Secondary | ICD-10-CM | POA: Insufficient documentation

## 2021-04-26 DIAGNOSIS — Z87891 Personal history of nicotine dependence: Secondary | ICD-10-CM | POA: Insufficient documentation

## 2021-04-26 DIAGNOSIS — D649 Anemia, unspecified: Secondary | ICD-10-CM | POA: Diagnosis not present

## 2021-04-26 DIAGNOSIS — C9110 Chronic lymphocytic leukemia of B-cell type not having achieved remission: Secondary | ICD-10-CM | POA: Diagnosis present

## 2021-04-26 DIAGNOSIS — Z90710 Acquired absence of both cervix and uterus: Secondary | ICD-10-CM | POA: Diagnosis not present

## 2021-04-26 DIAGNOSIS — I10 Essential (primary) hypertension: Secondary | ICD-10-CM | POA: Insufficient documentation

## 2021-04-26 DIAGNOSIS — D7589 Other specified diseases of blood and blood-forming organs: Secondary | ICD-10-CM | POA: Diagnosis not present

## 2021-04-26 DIAGNOSIS — D696 Thrombocytopenia, unspecified: Secondary | ICD-10-CM | POA: Insufficient documentation

## 2021-04-26 DIAGNOSIS — K5900 Constipation, unspecified: Secondary | ICD-10-CM | POA: Diagnosis not present

## 2021-04-26 LAB — CBC WITH DIFFERENTIAL/PLATELET
Abs Immature Granulocytes: 0.03 10*3/uL (ref 0.00–0.07)
Basophils Absolute: 0 10*3/uL (ref 0.0–0.1)
Basophils Relative: 0 %
Eosinophils Absolute: 0.1 10*3/uL (ref 0.0–0.5)
Eosinophils Relative: 0 %
HCT: 25.3 % — ABNORMAL LOW (ref 36.0–46.0)
Hemoglobin: 8.2 g/dL — ABNORMAL LOW (ref 12.0–15.0)
Immature Granulocytes: 0 %
Lymphocytes Relative: 95 %
Lymphs Abs: 39 10*3/uL — ABNORMAL HIGH (ref 0.7–4.0)
MCH: 35.3 pg — ABNORMAL HIGH (ref 26.0–34.0)
MCHC: 32.4 g/dL (ref 30.0–36.0)
MCV: 109.1 fL — ABNORMAL HIGH (ref 80.0–100.0)
Monocytes Absolute: 0.4 10*3/uL (ref 0.1–1.0)
Monocytes Relative: 1 %
Neutro Abs: 1.6 10*3/uL — ABNORMAL LOW (ref 1.7–7.7)
Neutrophils Relative %: 4 %
Platelets: 98 10*3/uL — ABNORMAL LOW (ref 150–400)
RBC: 2.32 MIL/uL — ABNORMAL LOW (ref 3.87–5.11)
RDW: 14.7 % (ref 11.5–15.5)
Smear Review: NORMAL
WBC: 41.2 10*3/uL — ABNORMAL HIGH (ref 4.0–10.5)
nRBC: 0 % (ref 0.0–0.2)

## 2021-04-26 LAB — SAMPLE TO BLOOD BANK

## 2021-04-27 ENCOUNTER — Inpatient Hospital Stay (HOSPITAL_BASED_OUTPATIENT_CLINIC_OR_DEPARTMENT_OTHER): Payer: Medicare Other | Admitting: Nurse Practitioner

## 2021-04-27 ENCOUNTER — Encounter: Payer: Self-pay | Admitting: Nurse Practitioner

## 2021-04-27 ENCOUNTER — Other Ambulatory Visit: Payer: Self-pay | Admitting: *Deleted

## 2021-04-27 ENCOUNTER — Ambulatory Visit: Payer: Medicare Other | Admitting: Oncology

## 2021-04-27 VITALS — BP 122/64 | HR 71 | Temp 98.3°F | Resp 16 | Wt 125.0 lb

## 2021-04-27 DIAGNOSIS — C9110 Chronic lymphocytic leukemia of B-cell type not having achieved remission: Secondary | ICD-10-CM

## 2021-04-27 DIAGNOSIS — D649 Anemia, unspecified: Secondary | ICD-10-CM

## 2021-04-27 NOTE — Progress Notes (Signed)
Patient here for follow up, she reports that she is having digestive problems and is planning on seeing her PCP for a referral. ?
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
## 2021-04-28 NOTE — Addendum Note (Signed)
Addended by: Vanice Sarah on: 04/28/2021 08:36 AM ? ? Modules accepted: Orders ? ?

## 2021-06-03 ENCOUNTER — Encounter: Payer: Self-pay | Admitting: Nurse Practitioner

## 2021-06-15 ENCOUNTER — Inpatient Hospital Stay: Payer: Medicare Other | Attending: Oncology

## 2021-06-15 NOTE — Progress Notes (Signed)
Nutrition Assessment   Reason for Assessment:  Constipation/diarrhea   ASSESSMENT:  88 year old female with CLL, anemia.  Past medical history of COPD, HTN, GERD, osteoporosis, hemorrhoids, colon cancer with 8 inches of transverse colon removed per patient, bleeding rectal fissure.  Patient not on treatment for CLL.    Met with patient in clinic.  Patient reports problems with constipation (sounds like more often) and diarrhea.  Patient is not on bowel regimen.  Usually eats raisin bran cereal for breakfast with fruit and hot tea or egg and toast or pancake.  Lunch (1:30pm) maybe grapes, 1/2 pear, peanut butter crackers and V-8 juice.  Supper maybe vegetables, potato salad, likes fish and tuna fish.  Says that cheese binds her up and collard greens give her diarrhea.  Had episode of constipation around Mother's Day took dulcolax then had diarrhea.     Medications: reviewed   Labs: reviewed   Anthropometrics:   Height: 61 inches Weight: 126 lb 3 oz 139 lb 06/20/2020 BMI: 23  9% weight loss in the last year, not significant   NUTRITION DIAGNOSIS: Food and nutrition related knowledge deficit related to altered GI function as evidenced by concerns about diet   INTERVENTION:  Encouraged keeping food diary.  Patient not interested in doing this. Wanting to try kefir that friend recommended.  Agree with adding probiotic. Can also consider psyllium fiber (metamucil or konsyl).   Contact information provided    Next Visit: no follow-up RD available as needed  Garcia Dalzell B. Zenia Resides, [place], [place] Registered Dietitian [phone]

## 2021-07-28 ENCOUNTER — Inpatient Hospital Stay (HOSPITAL_BASED_OUTPATIENT_CLINIC_OR_DEPARTMENT_OTHER): Payer: Medicare Other | Admitting: Nurse Practitioner

## 2021-07-28 ENCOUNTER — Inpatient Hospital Stay: Payer: Medicare Other | Attending: Oncology

## 2021-07-28 VITALS — BP 135/55 | HR 61 | Temp 97.9°F | Resp 16 | Wt 126.0 lb

## 2021-07-28 DIAGNOSIS — I10 Essential (primary) hypertension: Secondary | ICD-10-CM | POA: Diagnosis not present

## 2021-07-28 DIAGNOSIS — C9110 Chronic lymphocytic leukemia of B-cell type not having achieved remission: Secondary | ICD-10-CM | POA: Insufficient documentation

## 2021-07-28 DIAGNOSIS — D649 Anemia, unspecified: Secondary | ICD-10-CM

## 2021-07-28 DIAGNOSIS — Z90710 Acquired absence of both cervix and uterus: Secondary | ICD-10-CM | POA: Insufficient documentation

## 2021-07-28 DIAGNOSIS — D696 Thrombocytopenia, unspecified: Secondary | ICD-10-CM

## 2021-07-28 DIAGNOSIS — K5900 Constipation, unspecified: Secondary | ICD-10-CM | POA: Diagnosis not present

## 2021-07-28 LAB — CBC WITH DIFFERENTIAL/PLATELET
Abs Immature Granulocytes: 0.05 10*3/uL (ref 0.00–0.07)
Basophils Absolute: 0 10*3/uL (ref 0.0–0.1)
Basophils Relative: 0 %
Eosinophils Absolute: 0.1 10*3/uL (ref 0.0–0.5)
Eosinophils Relative: 0 %
HCT: 24.5 % — ABNORMAL LOW (ref 36.0–46.0)
Hemoglobin: 7.9 g/dL — ABNORMAL LOW (ref 12.0–15.0)
Immature Granulocytes: 0 %
Lymphocytes Relative: 96 %
Lymphs Abs: 45.1 10*3/uL — ABNORMAL HIGH (ref 0.7–4.0)
MCH: 35.6 pg — ABNORMAL HIGH (ref 26.0–34.0)
MCHC: 32.2 g/dL (ref 30.0–36.0)
MCV: 110.4 fL — ABNORMAL HIGH (ref 80.0–100.0)
Monocytes Absolute: 0.9 10*3/uL (ref 0.1–1.0)
Monocytes Relative: 2 %
Neutro Abs: 0.9 10*3/uL — ABNORMAL LOW (ref 1.7–7.7)
Neutrophils Relative %: 2 %
Platelets: 95 10*3/uL — ABNORMAL LOW (ref 150–400)
RBC: 2.22 MIL/uL — ABNORMAL LOW (ref 3.87–5.11)
RDW: 13.4 % (ref 11.5–15.5)
Smear Review: NORMAL
WBC: 47 10*3/uL — ABNORMAL HIGH (ref 4.0–10.5)
nRBC: 0 % (ref 0.0–0.2)

## 2021-07-28 LAB — COMPREHENSIVE METABOLIC PANEL
ALT: 11 U/L (ref 0–44)
AST: 19 U/L (ref 15–41)
Albumin: 3.7 g/dL (ref 3.5–5.0)
Alkaline Phosphatase: 59 U/L (ref 38–126)
Anion gap: 7 (ref 5–15)
BUN: 30 mg/dL — ABNORMAL HIGH (ref 8–23)
CO2: 23 mmol/L (ref 22–32)
Calcium: 8.5 mg/dL — ABNORMAL LOW (ref 8.9–10.3)
Chloride: 106 mmol/L (ref 98–111)
Creatinine, Ser: 1.33 mg/dL — ABNORMAL HIGH (ref 0.44–1.00)
GFR, Estimated: 36 mL/min — ABNORMAL LOW (ref >60)
Glucose, Bld: 118 mg/dL — ABNORMAL HIGH (ref 70–99)
Potassium: 4.4 mmol/L (ref 3.5–5.1)
Sodium: 136 mmol/L (ref 135–145)
Total Bilirubin: 0.7 mg/dL (ref 0.3–1.2)
Total Protein: 6.5 g/dL (ref 6.5–8.1)

## 2021-07-28 LAB — SAMPLE TO BLOOD BANK

## 2021-07-28 NOTE — Progress Notes (Signed)
[facility]  [phone]) [phone] [phone]) [phone]  ID: MARGORIE RENNER OB: 1921/09/22  MR#: [id number]  JKD#:[id number]  Patient Care Team: Idelle Crouch, MD as PCP - General (Internal Medicine) Lloyd Huger, MD as Consulting Physician (Hematology and Oncology)  CHIEF COMPLAINT: CLL, anemia.  INTERVAL HISTORY: Patient returns to clinic today for routine 40-monthevaluation and consideration of additional blood. She has chronic weakness and fatigue, but otherwise feels well.  She continues to be active and live by herself. She prefers to live alone and [facility]. Continues to have fecal urgency which is chronic and unchanged. She has no neurologic complaints.  She denies any fevers or night sweats.  She has a good appetite, but complains of dairrhea, constipation, poor dentition. She denies any chest pain, shortness of breath, cough, or hemoptysis.  She denies any nausea, vomiting, constipation, or diarrhea. She has no urinary complaints.  Patient offers no further specific complaints today.  REVIEW OF SYSTEMS:   Review of Systems  Constitutional:  Positive for malaise/fatigue. Negative for fever and weight loss.  Respiratory: Negative.  Negative for cough and shortness of breath.   Cardiovascular: Negative.  Negative for chest pain and leg swelling.  Gastrointestinal: Negative.  Negative for abdominal pain, constipation and heartburn.  Genitourinary: Negative.  Negative for dysuria.  Musculoskeletal: Negative.  Negative for falls and myalgias.  Skin: Negative.  Negative for rash.  Neurological:  Positive for weakness. Negative for focal weakness and headaches.  Psychiatric/Behavioral: Negative.  The patient is not nervous/anxious.   As per HPI. Otherwise, a complete review of systems is negative.  PAST MEDICAL HISTORY: Past Medical History:  Diagnosis Date   Arthritis    Cancer ([place]    [place] cancer 2009    Cataract    COPD (chronic obstructive pulmonary disease) ([place]    Heart disease    Hemorrhoids    Hypertension    Indigestion    Insomnia    Kidney stone    Osteoporosis     PAST SURGICAL HISTORY: Past Surgical History:  Procedure Laterality Date   ABDOMINAL HYSTERECTOMY     APPENDECTOMY     BREAST BIOPSY Left    negative 1982   CHOLECYSTECTOMY     colon cancer     COLON RESECTION     COLONOSCOPY  2008, 2010   TONSILLECTOMY      FAMILY HISTORY: Family History  Problem Relation Age of Onset   Breast cancer Neg Hx     ADVANCED DIRECTIVES (Y/N):  N  HEALTH MAINTENANCE: Social History   Tobacco Use   Smoking status: Former   Smokeless tobacco: Never  VScientific laboratory technicianUse: Never used  Substance Use Topics   Alcohol use: No   Drug use: No  [place]. Independent of ADLs. Husband passed away years ago. Enjoys her independence but has supportive children, grandchildren, and great grandchildren   Colonoscopy:  PAP:  Bone density:  Lipid panel:  Allergies  Allergen Reactions   Latex Shortness Of Breath and Other (See Comments)   Other Shortness Of Breath and Other (See Comments)    Dental work on teeth- there was something on gloves    Rituxan [Rituximab] Shortness Of Breath    And itching, nasueated   Sulfa Antibiotics Anaphylaxis    Current Outpatient Medications  Medication Sig Dispense Refill   carvedilol (COREG) 6.25 MG tablet Take 6.25 mg by mouth 2 (  two) times daily with a meal.     Cholecalciferol (VITAMIN D3) 25 MCG (1000 UT) CAPS Take 1 capsule by mouth daily.     docusate sodium (COLACE) 100 MG capsule Take 100 mg by mouth daily as needed for mild constipation.     famotidine (PEPCID) 40 MG tablet Take by mouth.     furosemide (LASIX) 20 MG tablet Take 20 mg by mouth as needed.     pantoprazole (PROTONIX) 40 MG tablet Take 40 mg by mouth daily as needed.     ramipril (ALTACE) 2.5 MG capsule Take 2.5 mg by mouth daily.      SIMETHICONE PO Take by mouth. (Patient not taking: Reported on 01/26/2021)     vitamin B-12 (CYANOCOBALAMIN) 1000 MCG tablet Take 1,000 mcg by mouth daily.     No current facility-administered medications for this visit.    OBJECTIVE: Vitals:   07/28/21 1335  BP: (!) 135/55  Pulse: 61  Resp: 16  Temp: 97.9 F (36.6 C)  SpO2: 99%     Body mass index is 23.81 kg/m.    ECOG FS:0 - Asymptomatic  General: Well-developed, well-nourished, no acute distress. Frail, elderly. Unaccompanied.  Eyes: Pink conjunctiva, anicteric sclera. HEENT: Normocephalic, moist mucous membranes. Lungs: No audible wheezing or coughing. Heart: Regular rate and rhythm. Abdomen: Soft, nontender, no obvious distention. Musculoskeletal: No edema, cyanosis, or clubbing. Cane.  Neuro: Alert, answering all questions appropriately. Cranial nerves grossly intact. Skin: No rashes or petechiae noted. Pale Psych: Normal affect.   LAB RESULTS:  Lab Results  Component Value Date   NA 136 07/28/2021   K 4.4 07/28/2021   CL 106 07/28/2021   CO2 23 07/28/2021   GLUCOSE 118 (H) 07/28/2021   BUN 30 (H) 07/28/2021   CREATININE 1.33 (H) 07/28/2021   CALCIUM 8.5 (L) 07/28/2021   PROT 6.5 07/28/2021   ALBUMIN 3.7 07/28/2021   AST 19 07/28/2021   ALT 11 07/28/2021   ALKPHOS 59 07/28/2021   BILITOT 0.7 07/28/2021   GFRNONAA 36 (L) 07/28/2021   GFRAA 53 (L) 06/14/2013    Lab Results  Component Value Date   WBC 47.0 (H) 07/28/2021   NEUTROABS 0.9 (L) 07/28/2021   HGB 7.9 (L) 07/28/2021   HCT 24.5 (L) 07/28/2021   MCV 110.4 (H) 07/28/2021   PLT 95 (L) 07/28/2021     STUDIES: No results found.  ASSESSMENT: CLL, Rai stage 0  PLAN:   1. CLL: Confirmed by peripheral blood flow cytometry.  Patient received minimal Rituxan prior to having a mild reaction and then treatment was discontinued.  Her white blood cell count remains elevated at 47. She continues to decline any further treatments. Could consider  Imbruvica in the future if necessary.  No intervention is needed at this time.  Return to clinic in 3 months with repeat laboratory work and further evaluation. 2.  Thrombocytopenia: Chronic and unchanged.  Patient's platelet count is 95 today. 3.  Anemia: Hemoglobin has dropped to 7.9. Mildly symptomatic. Proceed with transfusion of 1 unit of pRBCs. Plan for possible blood in 3 months if < 8 or sooner if she becomes symptomatic.  4.  Macrocytosis: Chronic and unchanged.  Previously B12 and folate were within normal limits.   5. Constipation/Diarrhea/Poor dentition- previously referred to [facility]. Patient declined interventions. Follow up with PCP.   Disposition: 1 unit of blood  3 mo- lab (cbc, hold tube, cmp), Dr. Grayland Ormond,  Day later +/- 1 unit- la  I spent a total of  30 minutes reviewing chart data, face-to-face evaluation with the patient, counseling and coordination of care as detailed above.  Thank you for allowing me to participate in the care of this very pleasant patient.   Patient expressed understanding and was in agreement with this plan. She also understands that She can call clinic at any time with any questions, concerns, or complaints.   Verlon Au, NP  07/28/2021

## 2021-07-28 NOTE — Progress Notes (Signed)
Pt returns for follow-up. Reports no changes since last visit, other than a UTI, which she recently completed antibiotics for.

## 2021-07-31 ENCOUNTER — Other Ambulatory Visit: Payer: Self-pay | Admitting: *Deleted

## 2021-07-31 ENCOUNTER — Inpatient Hospital Stay: Payer: Medicare Other | Attending: Oncology

## 2021-07-31 ENCOUNTER — Other Ambulatory Visit: Payer: Self-pay | Admitting: Medical Oncology

## 2021-07-31 DIAGNOSIS — C9110 Chronic lymphocytic leukemia of B-cell type not having achieved remission: Secondary | ICD-10-CM | POA: Insufficient documentation

## 2021-07-31 DIAGNOSIS — D649 Anemia, unspecified: Secondary | ICD-10-CM

## 2021-07-31 DIAGNOSIS — D696 Thrombocytopenia, unspecified: Secondary | ICD-10-CM | POA: Insufficient documentation

## 2021-07-31 LAB — CBC WITH DIFFERENTIAL/PLATELET
Abs Immature Granulocytes: 0.06 10*3/uL (ref 0.00–0.07)
Basophils Absolute: 0.1 10*3/uL (ref 0.0–0.1)
Basophils Relative: 0 %
Eosinophils Absolute: 0 10*3/uL (ref 0.0–0.5)
Eosinophils Relative: 0 %
HCT: 24.3 % — ABNORMAL LOW (ref 36.0–46.0)
Hemoglobin: 7.8 g/dL — ABNORMAL LOW (ref 12.0–15.0)
Immature Granulocytes: 0 %
Lymphocytes Relative: 95 %
Lymphs Abs: 40.3 10*3/uL — ABNORMAL HIGH (ref 0.7–4.0)
MCH: 35.3 pg — ABNORMAL HIGH (ref 26.0–34.0)
MCHC: 32.1 g/dL (ref 30.0–36.0)
MCV: 110 fL — ABNORMAL HIGH (ref 80.0–100.0)
Monocytes Absolute: 0.7 10*3/uL (ref 0.1–1.0)
Monocytes Relative: 2 %
Neutro Abs: 1.1 10*3/uL — ABNORMAL LOW (ref 1.7–7.7)
Neutrophils Relative %: 3 %
Platelets: 85 10*3/uL — ABNORMAL LOW (ref 150–400)
RBC: 2.21 MIL/uL — ABNORMAL LOW (ref 3.87–5.11)
RDW: 13.5 % (ref 11.5–15.5)
Smear Review: NORMAL
WBC: 42.4 10*3/uL — ABNORMAL HIGH (ref 4.0–10.5)
nRBC: 0.1 % (ref 0.0–0.2)

## 2021-07-31 LAB — SAMPLE TO BLOOD BANK

## 2021-07-31 LAB — PREPARE RBC (CROSSMATCH)

## 2021-08-02 ENCOUNTER — Ambulatory Visit: Payer: Medicare Other

## 2021-08-02 ENCOUNTER — Inpatient Hospital Stay: Payer: Medicare Other

## 2021-08-02 DIAGNOSIS — D649 Anemia, unspecified: Secondary | ICD-10-CM

## 2021-08-02 DIAGNOSIS — C9110 Chronic lymphocytic leukemia of B-cell type not having achieved remission: Secondary | ICD-10-CM | POA: Diagnosis not present

## 2021-08-02 MED ORDER — ACETAMINOPHEN 325 MG PO TABS
650.0000 mg | ORAL_TABLET | Freq: Once | ORAL | Status: AC
  Administered 2021-08-02: 650 mg via ORAL
  Filled 2021-08-02: qty 2

## 2021-08-02 MED ORDER — SODIUM CHLORIDE 0.9% IV SOLUTION
250.0000 mL | Freq: Once | INTRAVENOUS | Status: AC
  Administered 2021-08-02: 250 mL via INTRAVENOUS
  Filled 2021-08-02: qty 250

## 2021-08-02 MED ORDER — DIPHENHYDRAMINE HCL 50 MG/ML IJ SOLN
25.0000 mg | Freq: Once | INTRAMUSCULAR | Status: AC
  Administered 2021-08-02: 25 mg via INTRAVENOUS
  Filled 2021-08-02: qty 1

## 2021-08-02 NOTE — Patient Instructions (Signed)
[facility] CANCER [facility] AT [place]  Discharge Instructions: Thank you for choosing [facility] to provide your oncology and hematology care.  If you have a lab appointment with the [facility], please go directly to the [facility] and check in at the registration area.  Wear comfortable clothing and clothing appropriate for easy access to any Portacath or PICC line.   We strive to give you quality time with your provider. You may need to reschedule your appointment if you arrive late (15 or more minutes).  Arriving late affects you and other patients whose appointments are after yours.  Also, if you miss three or more appointments without notifying the office, you may be dismissed from the clinic at the provider's discretion.      For prescription refill requests, have your pharmacy contact our office and allow 72 hours for refills to be completed.    Today you received the following chemotherapy and/or immunotherapy agents ONE UNIT OF BLOOD       To help prevent nausea and vomiting after your treatment, we encourage you to take your nausea medication as directed.  BELOW ARE SYMPTOMS THAT SHOULD BE REPORTED IMMEDIATELY: *FEVER GREATER THAN 100.4 F (38 C) OR HIGHER *CHILLS OR SWEATING *NAUSEA AND VOMITING THAT IS NOT CONTROLLED WITH YOUR NAUSEA MEDICATION *UNUSUAL SHORTNESS OF BREATH *UNUSUAL BRUISING OR BLEEDING *URINARY PROBLEMS (pain or burning when urinating, or frequent urination) *BOWEL PROBLEMS (unusual diarrhea, constipation, pain near the anus) TENDERNESS IN MOUTH AND THROAT WITH OR WITHOUT PRESENCE OF ULCERS (sore throat, sores in mouth, or a toothache) UNUSUAL RASH, SWELLING OR PAIN  UNUSUAL VAGINAL DISCHARGE OR ITCHING   Items with * indicate a potential emergency and should be followed up as soon as possible or go to the Emergency Department if any problems should occur.  Please show the CHEMOTHERAPY ALERT CARD or IMMUNOTHERAPY ALERT CARD at  check-in to the Emergency Department and triage nurse.  Should you have questions after your visit or need to cancel or reschedule your appointment, please contact [facility] CANCER [place] AT [place]  [phone] and follow the prompts.  Office hours are 8:00 a.m. to 4:30 p.m. Monday - Friday. Please note that voicemails left after 4:00 p.m. may not be returned until the following business day.  We are closed weekends and major holidays. You have access to a nurse at all times for urgent questions. Please call the main number to the clinic [phone] and follow the prompts.  For any non-urgent questions, you may also contact your provider using MyChart. We now offer e-Visits for anyone 35 and older to request care online for non-urgent symptoms. For details visit [email or web address].   Also download the MyChart app! Go to the app store, search "MyChart", open the app, select [place], and log in with your MyChart username and password.  Masks are optional in the cancer centers. If you would like for your care team to wear a mask while they are taking care of you, please let them know. For doctor visits, patients may have with them one support person who is at least 86 years old. At this time, visitors are not allowed in the infusion area.  Blood Transfusion, Adult, Care After This sheet gives you information about how to care for yourself after your procedure. Your doctor may also give you more specific instructions. If you have problems or questions, contact your doctor. What can I expect after the procedure? After the procedure, it is common to have:  Bruising and soreness at the IV site. A headache. Follow these instructions at home: Insertion site care     Follow instructions from your doctor about how to take care of your insertion site. This is where an IV tube was put into your vein. Make sure you: Wash your hands with soap and water before and after you change your  bandage (dressing). If you cannot use soap and water, use hand sanitizer. Change your bandage as told by your doctor. Check your insertion site every day for signs of infection. Check for: Redness, swelling, or pain. Bleeding from the site. Warmth. Pus or a bad smell. General instructions Take over-the-counter and prescription medicines only as told by your doctor. Rest as told by your doctor. Go back to your normal activities as told by your doctor. Keep all follow-up visits as told by your doctor. This is important. Contact a doctor if: You have itching or red, swollen areas of skin (hives). You feel worried or nervous (anxious). You feel weak after doing your normal activities. You have redness, swelling, warmth, or pain around the insertion site. You have blood coming from the insertion site, and the blood does not stop with pressure. You have pus or a bad smell coming from the insertion site. Get help right away if: You have signs of a serious reaction. This may be coming from an allergy or the body's defense system (immune system). Signs include: Trouble breathing or shortness of breath. Swelling of the face or feeling warm (flushed). Fever or chills. Head, chest, or back pain. Dark pee (urine) or blood in the pee. Widespread rash. Fast heartbeat. Feeling dizzy or light-headed. You may receive your blood transfusion in an outpatient setting. If so, you will be told whom to contact to report any reactions. These symptoms may be an emergency. Do not wait to see if the symptoms will go away. Get medical help right away. Call your local emergency services (911 in the [place] Do not drive yourself to the hospital. Summary Bruising and soreness at the IV site are common. Check your insertion site every day for signs of infection. Rest as told by your doctor. Go back to your normal activities as told by your doctor. Get help right away if you have signs of a serious reaction. This  information is not intended to replace advice given to you by your health care provider. Make sure you discuss any questions you have with your health care provider. Document Revised: 05/12/2020 Document Reviewed: 07/10/2018 Elsevier Patient Education  [place].

## 2021-08-03 LAB — BPAM RBC
Blood Product Expiration Date: 202307072359
Blood Product Expiration Date: 202307132359
ISSUE DATE / TIME: 202307051048
Unit Type and Rh: 5100
Unit Type and Rh: 9500

## 2021-08-03 LAB — TYPE AND SCREEN
ABO/RH(D): O POS
Antibody Screen: NEGATIVE
Unit division: 0
Unit division: 0

## 2021-08-03 LAB — PREPARE RBC (CROSSMATCH)

## 2021-10-30 ENCOUNTER — Other Ambulatory Visit: Payer: Self-pay

## 2021-10-30 DIAGNOSIS — D696 Thrombocytopenia, unspecified: Secondary | ICD-10-CM

## 2021-10-30 DIAGNOSIS — D649 Anemia, unspecified: Secondary | ICD-10-CM

## 2021-10-30 NOTE — Progress Notes (Signed)
[facility]  Telephone:(336) [phone] Fax:(336) [phone]  ID: Stacy Holloway OB: 10/02/1921  MR#: [id number]  AOZ#:[id number]  Patient Care Team: Idelle Crouch, MD as PCP - General (Internal Medicine) Lloyd Huger, MD as Consulting Physician (Hematology and Oncology)  CHIEF COMPLAINT: CLL, anemia.  INTERVAL HISTORY: Patient returns to clinic today for repeat lab work and routine 81-monthevaluation.  She continues to have chronic weakness and fatigue, but remains living on her own and is active.  She has no neurologic complaints.  She denies any fevers or night sweats.  She has a good appetite.  She denies any chest pain, shortness of breath, cough, or hemoptysis.  She denies any nausea, vomiting, constipation, or diarrhea. She has no urinary complaints.  Patient offers no further specific complaints today.  REVIEW OF SYSTEMS:   Review of Systems  Constitutional:  Positive for malaise/fatigue. Negative for fever and weight loss.  Respiratory: Negative.  Negative for cough and shortness of breath.   Cardiovascular: Negative.  Negative for chest pain and leg swelling.  Gastrointestinal: Negative.  Negative for abdominal pain, constipation and heartburn.  Genitourinary: Negative.  Negative for dysuria.  Musculoskeletal: Negative.  Negative for falls and myalgias.  Skin: Negative.  Negative for rash.  Neurological:  Positive for weakness. Negative for focal weakness and headaches.  Psychiatric/Behavioral: Negative.  The patient is not nervous/anxious.     As per HPI. Otherwise, a complete review of systems is negative.  PAST MEDICAL HISTORY: Past Medical History:  Diagnosis Date   Arthritis    Cancer ([facility]    colon cancer 2009   Cataract    COPD (chronic obstructive pulmonary disease) ([facility]    Heart disease    Hemorrhoids    Hypertension    Indigestion    Insomnia    Kidney stone    Osteoporosis     PAST SURGICAL HISTORY: Past Surgical History:   Procedure Laterality Date   ABDOMINAL HYSTERECTOMY     APPENDECTOMY     BREAST BIOPSY Left    negative 1982   CHOLECYSTECTOMY     colon cancer     COLON RESECTION     COLONOSCOPY  2008, 2010   TONSILLECTOMY      FAMILY HISTORY: Family History  Problem Relation Age of Onset   Breast cancer Neg Hx     ADVANCED DIRECTIVES (Y/N):  N  HEALTH MAINTENANCE: Social History   Tobacco Use   Smoking status: Former   Smokeless tobacco: Never  VScientific laboratory technicianUse: Never used  Substance Use Topics   Alcohol use: No   Drug use: No     Colonoscopy:  PAP:  Bone density:  Lipid panel:  Allergies  Allergen Reactions   Latex Shortness Of Breath and Other (See Comments)   Other Shortness Of Breath and Other (See Comments)    Dental work on teeth- there was something on gloves    Rituxan [Rituximab] Shortness Of Breath    And itching, nasueated   Sulfa Antibiotics Anaphylaxis   Magnesium Diarrhea    Current Outpatient Medications  Medication Sig Dispense Refill   carvedilol (COREG) 6.25 MG tablet Take 6.25 mg by mouth 2 (two) times daily with a meal.     Cholecalciferol (VITAMIN D3) 25 MCG (1000 UT) CAPS Take 1 capsule by mouth daily.     docusate sodium (COLACE) 100 MG capsule Take 100 mg by mouth daily as needed for mild constipation.     famotidine (PEPCID)  40 MG tablet Take by mouth.     pantoprazole (PROTONIX) 40 MG tablet Take 40 mg by mouth daily as needed.     ramipril (ALTACE) 2.5 MG capsule Take 2.5 mg by mouth daily.     vitamin B-12 (CYANOCOBALAMIN) 1000 MCG tablet Take 1,000 mcg by mouth daily.     furosemide (LASIX) 20 MG tablet Take 20 mg by mouth as needed. (Patient not taking: Reported on 10/31/2021)     SIMETHICONE PO Take by mouth. (Patient not taking: Reported on 01/26/2021)     No current facility-administered medications for this visit.    OBJECTIVE: Vitals:   10/31/21 1338  BP: 114/68  Pulse: 65  Resp: 16  Temp: 98.9 F (37.2 C)  SpO2: 99%      Body mass index is 23.43 kg/m.    ECOG FS:0 - Asymptomatic  General: Well-developed, well-nourished, no acute distress. Eyes: Pink conjunctiva, anicteric sclera. HEENT: Normocephalic, moist mucous membranes. Lungs: No audible wheezing or coughing. Heart: Regular rate and rhythm. Abdomen: Soft, nontender, no obvious distention. Musculoskeletal: No edema, cyanosis, or clubbing. Neuro: Alert, answering all questions appropriately. Cranial nerves grossly intact. Skin: No rashes or petechiae noted. Psych: Normal affect.   LAB RESULTS:  Lab Results  Component Value Date   NA 136 07/28/2021   K 4.4 07/28/2021   CL 106 07/28/2021   CO2 23 07/28/2021   GLUCOSE 118 (H) 07/28/2021   BUN 30 (H) 07/28/2021   CREATININE 1.33 (H) 07/28/2021   CALCIUM 8.5 (L) 07/28/2021   PROT 6.5 07/28/2021   ALBUMIN 3.7 07/28/2021   AST 19 07/28/2021   ALT 11 07/28/2021   ALKPHOS 59 07/28/2021   BILITOT 0.7 07/28/2021   GFRNONAA 36 (L) 07/28/2021   GFRAA 53 (L) 06/14/2013    Lab Results  Component Value Date   WBC 36.0 (H) 10/31/2021   NEUTROABS 0.9 (L) 10/31/2021   HGB 7.8 (L) 10/31/2021   HCT 24.0 (L) 10/31/2021   MCV 106.2 (H) 10/31/2021   PLT 89 (L) 10/31/2021     STUDIES: No results found.  ASSESSMENT: CLL, Rai stage 0  PLAN:   1. CLL: Confirmed by peripheral blood flow cytometry.  Patient received minimal Rituxan prior to having a mild reaction and then treatment was discontinued.  Her white blood cell count remains elevated, but slightly decreased to 36.0. Could consider Imbruvica in the future if necessary.  No intervention is needed at this time.  Return to clinic in 3 months with repeat laboratory work and evaluation by APP. 2.  Thrombocytopenia: Chronic and unchanged.  Patient's platelet count is 89 today. 3.  Anemia: Although patient's hemoglobin has declined to 7.8 and she is symptomatic, she is declined blood transfusion.   4.  Macrocytosis: Chronic and unchanged.   Previously B12 and folate were within normal limits.     Patient expressed understanding and was in agreement with this plan. She also understands that She can call clinic at any time with any questions, concerns, or complaints.    Lloyd Huger, MD   10/31/2021 4:16 PM    As needed.

## 2021-10-31 ENCOUNTER — Encounter: Payer: Self-pay | Admitting: Oncology

## 2021-10-31 ENCOUNTER — Inpatient Hospital Stay: Payer: Medicare Other | Admitting: Oncology

## 2021-10-31 ENCOUNTER — Inpatient Hospital Stay: Payer: Medicare Other | Attending: Oncology

## 2021-10-31 ENCOUNTER — Other Ambulatory Visit: Payer: Self-pay

## 2021-10-31 ENCOUNTER — Inpatient Hospital Stay: Payer: Medicare Other

## 2021-10-31 VITALS — BP 114/68 | HR 65 | Temp 98.9°F | Resp 16 | Ht 61.0 in | Wt 124.0 lb

## 2021-10-31 DIAGNOSIS — I10 Essential (primary) hypertension: Secondary | ICD-10-CM | POA: Diagnosis not present

## 2021-10-31 DIAGNOSIS — Z23 Encounter for immunization: Secondary | ICD-10-CM

## 2021-10-31 DIAGNOSIS — C9110 Chronic lymphocytic leukemia of B-cell type not having achieved remission: Secondary | ICD-10-CM | POA: Insufficient documentation

## 2021-10-31 DIAGNOSIS — D696 Thrombocytopenia, unspecified: Secondary | ICD-10-CM | POA: Insufficient documentation

## 2021-10-31 DIAGNOSIS — Z87891 Personal history of nicotine dependence: Secondary | ICD-10-CM | POA: Diagnosis not present

## 2021-10-31 DIAGNOSIS — Z85038 Personal history of other malignant neoplasm of large intestine: Secondary | ICD-10-CM | POA: Diagnosis not present

## 2021-10-31 DIAGNOSIS — D7589 Other specified diseases of blood and blood-forming organs: Secondary | ICD-10-CM | POA: Diagnosis not present

## 2021-10-31 DIAGNOSIS — D649 Anemia, unspecified: Secondary | ICD-10-CM

## 2021-10-31 DIAGNOSIS — Z90710 Acquired absence of both cervix and uterus: Secondary | ICD-10-CM | POA: Insufficient documentation

## 2021-10-31 LAB — CBC WITH DIFFERENTIAL/PLATELET
Abs Immature Granulocytes: 0.05 10*3/uL (ref 0.00–0.07)
Basophils Absolute: 0.1 10*3/uL (ref 0.0–0.1)
Basophils Relative: 0 %
Eosinophils Absolute: 0 10*3/uL (ref 0.0–0.5)
Eosinophils Relative: 0 %
HCT: 24 % — ABNORMAL LOW (ref 36.0–46.0)
Hemoglobin: 7.8 g/dL — ABNORMAL LOW (ref 12.0–15.0)
Immature Granulocytes: 0 %
Lymphocytes Relative: 95 %
Lymphs Abs: 34.1 10*3/uL — ABNORMAL HIGH (ref 0.7–4.0)
MCH: 34.5 pg — ABNORMAL HIGH (ref 26.0–34.0)
MCHC: 32.5 g/dL (ref 30.0–36.0)
MCV: 106.2 fL — ABNORMAL HIGH (ref 80.0–100.0)
Monocytes Absolute: 0.9 10*3/uL (ref 0.1–1.0)
Monocytes Relative: 2 %
Neutro Abs: 0.9 10*3/uL — ABNORMAL LOW (ref 1.7–7.7)
Neutrophils Relative %: 3 %
Platelets: 89 10*3/uL — ABNORMAL LOW (ref 150–400)
RBC: 2.26 MIL/uL — ABNORMAL LOW (ref 3.87–5.11)
RDW: 14.4 % (ref 11.5–15.5)
Smear Review: NORMAL
WBC: 36 10*3/uL — ABNORMAL HIGH (ref 4.0–10.5)
nRBC: 0.1 % (ref 0.0–0.2)

## 2021-10-31 LAB — SAMPLE TO BLOOD BANK

## 2021-10-31 MED ORDER — INFLUENZA VAC A&B SA ADJ QUAD 0.5 ML IM PRSY
0.5000 mL | PREFILLED_SYRINGE | Freq: Once | INTRAMUSCULAR | Status: AC
  Administered 2021-10-31: 0.5 mL via INTRAMUSCULAR
  Filled 2021-10-31: qty 0.5

## 2022-01-03 ENCOUNTER — Other Ambulatory Visit: Payer: Self-pay | Admitting: *Deleted

## 2022-01-03 DIAGNOSIS — D649 Anemia, unspecified: Secondary | ICD-10-CM

## 2022-01-04 ENCOUNTER — Other Ambulatory Visit: Payer: Self-pay | Admitting: Oncology

## 2022-01-04 DIAGNOSIS — D509 Iron deficiency anemia, unspecified: Secondary | ICD-10-CM

## 2022-01-05 MED FILL — Iron Sucrose Inj 20 MG/ML (Fe Equiv): INTRAVENOUS | Qty: 10 | Status: AC

## 2022-01-08 ENCOUNTER — Inpatient Hospital Stay: Payer: Medicare Other | Attending: Oncology

## 2022-01-08 DIAGNOSIS — C9110 Chronic lymphocytic leukemia of B-cell type not having achieved remission: Secondary | ICD-10-CM | POA: Insufficient documentation

## 2022-01-08 DIAGNOSIS — D649 Anemia, unspecified: Secondary | ICD-10-CM | POA: Diagnosis not present

## 2022-01-08 DIAGNOSIS — D696 Thrombocytopenia, unspecified: Secondary | ICD-10-CM | POA: Insufficient documentation

## 2022-01-08 LAB — CBC WITH DIFFERENTIAL/PLATELET
Abs Immature Granulocytes: 0.02 10*3/uL (ref 0.00–0.07)
Basophils Absolute: 0.1 10*3/uL (ref 0.0–0.1)
Basophils Relative: 0 %
Eosinophils Absolute: 0 10*3/uL (ref 0.0–0.5)
Eosinophils Relative: 0 %
HCT: 26.1 % — ABNORMAL LOW (ref 36.0–46.0)
Hemoglobin: 8.4 g/dL — ABNORMAL LOW (ref 12.0–15.0)
Immature Granulocytes: 0 %
Lymphocytes Relative: 97 %
Lymphs Abs: 33.3 10*3/uL — ABNORMAL HIGH (ref 0.7–4.0)
MCH: 34.4 pg — ABNORMAL HIGH (ref 26.0–34.0)
MCHC: 32.2 g/dL (ref 30.0–36.0)
MCV: 107 fL — ABNORMAL HIGH (ref 80.0–100.0)
Monocytes Absolute: 0.4 10*3/uL (ref 0.1–1.0)
Monocytes Relative: 1 %
Neutro Abs: 0.7 10*3/uL — ABNORMAL LOW (ref 1.7–7.7)
Neutrophils Relative %: 2 %
Platelets: 110 10*3/uL — ABNORMAL LOW (ref 150–400)
RBC: 2.44 MIL/uL — ABNORMAL LOW (ref 3.87–5.11)
RDW: 13.4 % (ref 11.5–15.5)
Smear Review: NORMAL
WBC: 34.5 10*3/uL — ABNORMAL HIGH (ref 4.0–10.5)
nRBC: 0.1 % (ref 0.0–0.2)

## 2022-01-08 LAB — SAMPLE TO BLOOD BANK

## 2022-01-08 LAB — IRON AND TIBC
Iron: 67 ug/dL (ref 28–170)
Saturation Ratios: 26 % (ref 10.4–31.8)
TIBC: 258 ug/dL (ref 250–450)
UIBC: 191 ug/dL

## 2022-01-08 LAB — FERRITIN: Ferritin: 171 ng/mL (ref 11–307)

## 2022-01-08 MED FILL — Iron Sucrose Inj 20 MG/ML (Fe Equiv): INTRAVENOUS | Qty: 10 | Status: AC

## 2022-01-09 ENCOUNTER — Inpatient Hospital Stay: Payer: Medicare Other

## 2022-01-09 ENCOUNTER — Inpatient Hospital Stay (HOSPITAL_BASED_OUTPATIENT_CLINIC_OR_DEPARTMENT_OTHER): Payer: Medicare Other | Admitting: Nurse Practitioner

## 2022-01-09 ENCOUNTER — Other Ambulatory Visit: Payer: Self-pay

## 2022-01-09 ENCOUNTER — Ambulatory Visit: Payer: Medicare Other

## 2022-01-09 ENCOUNTER — Inpatient Hospital Stay: Payer: Medicare Other | Admitting: Oncology

## 2022-01-09 VITALS — BP 126/53 | HR 58 | Temp 97.3°F | Resp 18 | Ht 61.0 in | Wt 123.0 lb

## 2022-01-09 DIAGNOSIS — D509 Iron deficiency anemia, unspecified: Secondary | ICD-10-CM

## 2022-01-09 DIAGNOSIS — D649 Anemia, unspecified: Secondary | ICD-10-CM | POA: Diagnosis not present

## 2022-01-09 DIAGNOSIS — D696 Thrombocytopenia, unspecified: Secondary | ICD-10-CM

## 2022-01-09 DIAGNOSIS — C9110 Chronic lymphocytic leukemia of B-cell type not having achieved remission: Secondary | ICD-10-CM | POA: Diagnosis not present

## 2022-01-09 NOTE — Progress Notes (Signed)
[place]  [phone]) [phone] [phone]) [phone]  ID: Stacy Holloway OB: 02/16/1921  MR#: [id number]  [id number]  Patient Care Team: Idelle Crouch, MD as PCP - General (Internal Medicine) Lloyd Huger, MD as Consulting Physician (Hematology and Oncology)  CHIEF COMPLAINT: CLL, anemia  INTERVAL HISTORY: Patient returns to clinic today for repeat lab work and routine 85-monthevaluation.  She continues to have chronic weakness and fatigue, but remains living on her own and is active.  She has no neurologic complaints.  She denies any fevers or night sweats.  She has a good appetite.  She denies any chest pain, shortness of breath, cough, or hemoptysis.  She denies any nausea, vomiting, constipation, or diarrhea. She has no urinary complaints.  Patient offers no further specific complaints today.  REVIEW OF SYSTEMS:   Review of Systems  Constitutional:  Positive for malaise/fatigue. Negative for fever and weight loss.  Respiratory: Negative.  Negative for cough and shortness of breath.   Cardiovascular: Negative.  Negative for chest pain and leg swelling.  Gastrointestinal: Negative.  Negative for abdominal pain, constipation and heartburn.  Genitourinary: Negative.  Negative for dysuria.  Musculoskeletal: Negative.  Negative for falls and myalgias.  Skin: Negative.  Negative for rash.  Neurological:  Positive for weakness. Negative for focal weakness and headaches.  Psychiatric/Behavioral: Negative.  The patient is not nervous/anxious.   As per HPI. Otherwise, a complete review of systems is negative.  PAST MEDICAL HISTORY: Past Medical History:  Diagnosis Date   Arthritis    Cancer ([facility]    colon cancer 2009   Cataract    COPD (chronic obstructive pulmonary disease) ([place]    Heart disease    Hemorrhoids    Hypertension    Indigestion    Insomnia    Kidney stone    Osteoporosis     PAST SURGICAL HISTORY: Past Surgical History:   Procedure Laterality Date   ABDOMINAL HYSTERECTOMY     APPENDECTOMY     BREAST BIOPSY Left    negative 1982   CHOLECYSTECTOMY     colon cancer     COLON RESECTION     COLONOSCOPY  2008, 2010   TONSILLECTOMY      FAMILY HISTORY: Family History  Problem Relation Age of Onset   Breast cancer Neg Hx     ADVANCED DIRECTIVES (Y/N):  N  HEALTH MAINTENANCE: Social History   Tobacco Use   Smoking status: Former   Smokeless tobacco: Never  VScientific laboratory technicianUse: Never used  Substance Use Topics   Alcohol use: No   Drug use: No     Colonoscopy:  PAP:  Bone density:  Lipid panel:  Allergies  Allergen Reactions   Latex Shortness Of Breath and Other (See Comments)   Other Shortness Of Breath and Other (See Comments)    Dental work on teeth- there was something on gloves    Rituxan [Rituximab] Shortness Of Breath    And itching, nasueated   Sulfa Antibiotics Anaphylaxis   Magnesium Diarrhea    Current Outpatient Medications  Medication Sig Dispense Refill   carvedilol (COREG) 6.25 MG tablet Take 6.25 mg by mouth 2 (two) times daily with a meal.     Cholecalciferol (VITAMIN D3) 25 MCG (1000 UT) CAPS Take 1 capsule by mouth daily.     docusate sodium (COLACE) 100 MG capsule Take 100 mg by mouth daily as needed for mild constipation.     pantoprazole (PROTONIX) 40 MG  tablet Take 40 mg by mouth daily as needed.     ramipril (ALTACE) 2.5 MG capsule Take 2.5 mg by mouth daily.     vitamin B-12 (CYANOCOBALAMIN) 1000 MCG tablet Take 1,000 mcg by mouth daily.     SIMETHICONE PO Take by mouth. (Patient not taking: Reported on 01/26/2021)     No current facility-administered medications for this visit.    OBJECTIVE: Vitals:   01/09/22 1500  BP: (!) 126/53  Pulse: (!) 58  Resp: 18  Temp: (!) 97.3 F (36.3 C)     Body mass index is 23.24 kg/m.    ECOG FS:0 - Asymptomatic  General: Well-developed, well-nourished, no acute distress. Eyes: Pink conjunctiva, anicteric  sclera. HEENT: Normocephalic, moist mucous membranes. Lungs: No audible wheezing or coughing. Heart: Regular rate and rhythm. Abdomen: Soft, nontender, no obvious distention. Musculoskeletal: No edema, cyanosis, or clubbing. Neuro: Alert, answering all questions appropriately. Cranial nerves grossly intact. Skin: No rashes or petechiae noted. Psych: Normal affect.   LAB RESULTS:  Lab Results  Component Value Date   NA 136 07/28/2021   K 4.4 07/28/2021   CL 106 07/28/2021   CO2 23 07/28/2021   GLUCOSE 118 (H) 07/28/2021   BUN 30 (H) 07/28/2021   CREATININE 1.33 (H) 07/28/2021   CALCIUM 8.5 (L) 07/28/2021   PROT 6.5 07/28/2021   ALBUMIN 3.7 07/28/2021   AST 19 07/28/2021   ALT 11 07/28/2021   ALKPHOS 59 07/28/2021   BILITOT 0.7 07/28/2021   GFRNONAA 36 (L) 07/28/2021   GFRAA 53 (L) 06/14/2013    Lab Results  Component Value Date   WBC 34.5 (H) 01/08/2022   NEUTROABS 0.7 (L) 01/08/2022   HGB 8.4 (L) 01/08/2022   HCT 26.1 (L) 01/08/2022   MCV 107.0 (H) 01/08/2022   PLT 110 (L) 01/08/2022   Iron/TIBC/Ferritin/ %Sat    Component Value Date/Time   IRON 67 01/08/2022 1333   TIBC 258 01/08/2022 1333   FERRITIN 171 01/08/2022 1333   IRONPCTSAT 26 01/08/2022 1333     STUDIES: No results found.  ASSESSMENT: CLL, Rai stage 0  PLAN:   1. CLL: Confirmed by peripheral blood flow cytometry.  Patient received minimal Rituxan prior to having a mild reaction and then treatment was discontinued.  Her white blood cell count remains elevated, but slightly decreased to 34.5. Could consider Imbruvica in the future if necessary.  No intervention is needed at this time.  Return to clinic in 3 months with repeat laboratory work and evaluation by APP or Dr Grayland Ormond.  2.  Thrombocytopenia: Chronic and unchanged.  Patient's platelet count is 110 today.  3.  Anemia: She has previously declined blood transfusion despite hemoglobin dropping to 7.8 and her being symptomatic. Today,  hemoglobin improved to 8.4. She remains mildly symptomatic. Iron studies are reassuring which supports anemia of chronic disease. Hold IV iron.   4.  Macrocytosis: Chronic and unchanged.  Previously B12 and folate were within normal limits.     Patient expressed understanding and was in agreement with this plan. She also understands that She can call clinic at any time with any questions, concerns, or complaints.    Verlon Au, NP   01/09/2022

## 2022-01-31 ENCOUNTER — Ambulatory Visit: Payer: Medicare Other | Admitting: Medical Oncology

## 2022-01-31 ENCOUNTER — Other Ambulatory Visit: Payer: Medicare Other

## 2022-03-17 ENCOUNTER — Encounter: Payer: Self-pay | Admitting: Oncology

## 2022-04-05 ENCOUNTER — Encounter: Payer: Self-pay | Admitting: Oncology

## 2022-04-16 ENCOUNTER — Other Ambulatory Visit: Payer: Medicare Other

## 2022-04-17 ENCOUNTER — Ambulatory Visit: Payer: Medicare Other | Admitting: Oncology

## 2022-04-17 ENCOUNTER — Ambulatory Visit: Payer: Medicare Other

## 2022-04-19 ENCOUNTER — Emergency Department: Payer: Medicare Other

## 2022-04-19 ENCOUNTER — Emergency Department
Admission: EM | Admit: 2022-04-19 | Discharge: 2022-04-19 | Disposition: A | Discharge status: Home / Self Care | Payer: Medicare Other | Attending: Emergency Medicine | Admitting: Emergency Medicine

## 2022-04-19 ENCOUNTER — Other Ambulatory Visit: Payer: Self-pay

## 2022-04-19 ENCOUNTER — Encounter: Payer: Self-pay | Admitting: Oncology

## 2022-04-19 DIAGNOSIS — I10 Essential (primary) hypertension: Secondary | ICD-10-CM | POA: Diagnosis not present

## 2022-04-19 DIAGNOSIS — N289 Disorder of kidney and ureter, unspecified: Secondary | ICD-10-CM | POA: Diagnosis not present

## 2022-04-19 DIAGNOSIS — Z856 Personal history of leukemia: Secondary | ICD-10-CM | POA: Insufficient documentation

## 2022-04-19 DIAGNOSIS — D72829 Elevated white blood cell count, unspecified: Secondary | ICD-10-CM | POA: Insufficient documentation

## 2022-04-19 DIAGNOSIS — R197 Diarrhea, unspecified: Secondary | ICD-10-CM | POA: Insufficient documentation

## 2022-04-19 DIAGNOSIS — J449 Chronic obstructive pulmonary disease, unspecified: Secondary | ICD-10-CM | POA: Diagnosis not present

## 2022-04-19 LAB — CBC
HCT: 25.6 % — ABNORMAL LOW (ref 36.0–46.0)
Hemoglobin: 8.1 g/dL — ABNORMAL LOW (ref 12.0–15.0)
MCH: 33.2 pg (ref 26.0–34.0)
MCHC: 31.6 g/dL (ref 30.0–36.0)
MCV: 104.9 fL — ABNORMAL HIGH (ref 80.0–100.0)
Platelets: 91 10*3/uL — ABNORMAL LOW (ref 150–400)
RBC: 2.44 MIL/uL — ABNORMAL LOW (ref 3.87–5.11)
RDW: 14.1 % (ref 11.5–15.5)
WBC: 33.8 10*3/uL — ABNORMAL HIGH (ref 4.0–10.5)
nRBC: 0.1 % (ref 0.0–0.2)

## 2022-04-19 LAB — BASIC METABOLIC PANEL
Anion gap: 7 (ref 5–15)
BUN: 38 mg/dL — ABNORMAL HIGH (ref 8–23)
CO2: 21 mmol/L — ABNORMAL LOW (ref 22–32)
Calcium: 8.4 mg/dL — ABNORMAL LOW (ref 8.9–10.3)
Chloride: 105 mmol/L (ref 98–111)
Creatinine, Ser: 1.19 mg/dL — ABNORMAL HIGH (ref 0.44–1.00)
GFR, Estimated: 41 mL/min — ABNORMAL LOW (ref >60)
Glucose, Bld: 111 mg/dL — ABNORMAL HIGH (ref 70–99)
Potassium: 4.7 mmol/L (ref 3.5–5.1)
Sodium: 133 mmol/L — ABNORMAL LOW (ref 135–145)

## 2022-04-19 LAB — TROPONIN I (HIGH SENSITIVITY)
Troponin I (High Sensitivity): 6 ng/L (ref <18)
Troponin I (High Sensitivity): 7 ng/L (ref <18)

## 2022-04-19 NOTE — ED Notes (Signed)
Pt ambulated to bathroom after expressing the urge to void. EDT assisted pt on and off the toilet. Pt reconnected to monitoring devices, got comfortable in bed. Bed rails up to promote safety, call bell within reach, and bed locked in lowest position. Pt and pts family member denied needing anything else at this time.

## 2022-04-19 NOTE — ED Provider Notes (Signed)
[facility] Provider Note    Event Date/Time   First MD Initiated Contact with Patient 04/19/22 1939     (approximate)  History   Chief Complaint: Diarrhea and Chest Pain  HPI  Stacy Holloway is a 89 y.o. female with a past medical history of COPD, hypertension, CLL, presents to the emergency department for diarrhea.  According to the patient she goes through episodes of constipation and diarrhea, states several days ago she took a dose of MiraLAX but that it did not work.  Patient states today she has had 5 episodes of diarrhea which she describes as very loose stool.  Denies any black or blood.  States some nausea but denies any vomiting.  Patient states she had some pain on the left flank radiating into the chest which concerned her but resolved after she had an episode of diarrhea.  No fever.  Physical Exam   Triage Vital Signs: ED Triage Vitals  Enc Vitals Group     BP 04/19/22 1739 (!) 133/59     Pulse Rate 04/19/22 1739 75     Resp 04/19/22 1739 16     Temp 04/19/22 1739 99.1 F (37.3 C)     Temp Source 04/19/22 1739 Oral     SpO2 04/19/22 1739 97 %     Weight 04/19/22 1740 119 lb (54 kg)     Height --      Head Circumference --      Peak Flow --      Pain Score 04/19/22 1743 0     Pain Loc --      Pain Edu? --      Excl. in [place]? --     Most recent vital signs: Vitals:   04/19/22 1739  BP: (!) 133/59  Pulse: 75  Resp: 16  Temp: 99.1 F (37.3 C)  SpO2: 97%    General: Awake, no distress.  CV:  Good peripheral perfusion.  Regular rate and rhythm  Resp:  Normal effort.  Equal breath sounds bilaterally.  Abd:  No distention.  Soft, nontender.  No rebound or guarding.  ED Results / Procedures / Treatments   EKG  EKG viewed and interpreted by myself shows normal sinus rhythm 80 bpm with a narrow QRS, normal axis, normal intervals, no concerning ST changes.  RADIOLOGY  I have reviewed and interpreted chest x-ray images.  No  consolidation seen on my evaluation. Radiology has read the x-ray as negative.   MEDICATIONS ORDERED IN ED: Medications - No data to display   IMPRESSION / MDM / [place] / ED COURSE  I reviewed the triage vital signs and the nursing notes.  Patient's presentation is most consistent with acute presentation with potential threat to life or bodily function.  Patient presents emergency department for diarrhea and abdominal and chest cramping that resolved after diarrhea.  Patient states a history of constipation oftentimes followed by diarrhea.  Took MiraLAX several days ago.  Patient's lab work does show significant leukocytosis of 33,000 however the patient has a history of CLL and this appears to be baseline for the patient.  She is also anemic with a hemoglobin of 8.1 however in reviewing the patient's historical labs this is also chronic for the patient and largely unchanged.  Patient's chemistry shows mild renal insufficiency but no concerning findings.  Troponin is reassuringly negative.  EKG shows no concerning findings and chest x-ray is clear.  However given the patient's age and multiple  episodes of diarrhea with abdominal cramping earlier today we will obtain a CT scan to further evaluate.  Patient agreeable to plan.  Patient's workup overall reassuring.  CT scan shows no acute abnormality does show fairly diffuse abdominal adenopathy however this is likely related the patients CLL.  Patient to follow-up with her doctor.  Patient daughter agreeable to plan of care.  FINAL CLINICAL IMPRESSION(S) / ED DIAGNOSES   Diarrhea   Note:  This document was prepared using Dragon voice recognition software and may include unintentional dictation errors.   Harvest Dark, MD 04/19/22 2114

## 2022-04-19 NOTE — ED Triage Notes (Signed)
PT states she woke up at 6 am today w/ 5 episodes of diarrhea. Pt c/o weakness, decreased hydration and appetite and left sided CP. Pt AOX4, NAD noted. Pt denies dizziness, SHOB, N/V.

## 2022-04-19 NOTE — ED Notes (Signed)
Reviewed pt's labs; acuity level changed and charge nurse notified of exam room needed

## 2022-04-22 ENCOUNTER — Encounter: Payer: Self-pay | Admitting: Oncology

## 2022-04-27 ENCOUNTER — Inpatient Hospital Stay: Payer: Medicare Other | Attending: Oncology

## 2022-04-27 DIAGNOSIS — D696 Thrombocytopenia, unspecified: Secondary | ICD-10-CM | POA: Insufficient documentation

## 2022-04-27 DIAGNOSIS — C9110 Chronic lymphocytic leukemia of B-cell type not having achieved remission: Secondary | ICD-10-CM | POA: Insufficient documentation

## 2022-04-27 LAB — CBC WITH DIFFERENTIAL/PLATELET
Abs Immature Granulocytes: 0.06 10*3/uL (ref 0.00–0.07)
Basophils Absolute: 0.1 10*3/uL (ref 0.0–0.1)
Basophils Relative: 0 %
Eosinophils Absolute: 0 10*3/uL (ref 0.0–0.5)
Eosinophils Relative: 0 %
HCT: 24.3 % — ABNORMAL LOW (ref 36.0–46.0)
Hemoglobin: 7.9 g/dL — ABNORMAL LOW (ref 12.0–15.0)
Immature Granulocytes: 0 %
Lymphocytes Relative: 95 %
Lymphs Abs: 30 10*3/uL — ABNORMAL HIGH (ref 0.7–4.0)
MCH: 33.8 pg (ref 26.0–34.0)
MCHC: 32.5 g/dL (ref 30.0–36.0)
MCV: 103.8 fL — ABNORMAL HIGH (ref 80.0–100.0)
Monocytes Absolute: 0.4 10*3/uL (ref 0.1–1.0)
Monocytes Relative: 1 %
Neutro Abs: 1.2 10*3/uL — ABNORMAL LOW (ref 1.7–7.7)
Neutrophils Relative %: 4 %
Platelets: 101 10*3/uL — ABNORMAL LOW (ref 150–400)
RBC: 2.34 MIL/uL — ABNORMAL LOW (ref 3.87–5.11)
RDW: 14.2 % (ref 11.5–15.5)
Smear Review: NORMAL
WBC: 31.8 10*3/uL — ABNORMAL HIGH (ref 4.0–10.5)
nRBC: 0.1 % (ref 0.0–0.2)

## 2022-04-27 LAB — COMPREHENSIVE METABOLIC PANEL
ALT: 11 U/L (ref 0–44)
AST: 21 U/L (ref 15–41)
Albumin: 3.6 g/dL (ref 3.5–5.0)
Alkaline Phosphatase: 49 U/L (ref 38–126)
Anion gap: 6 (ref 5–15)
BUN: 26 mg/dL — ABNORMAL HIGH (ref 8–23)
CO2: 23 mmol/L (ref 22–32)
Calcium: 8.9 mg/dL (ref 8.9–10.3)
Chloride: 106 mmol/L (ref 98–111)
Creatinine, Ser: 1.42 mg/dL — ABNORMAL HIGH (ref 0.44–1.00)
GFR, Estimated: 33 mL/min — ABNORMAL LOW (ref >60)
Glucose, Bld: 113 mg/dL — ABNORMAL HIGH (ref 70–99)
Potassium: 4.5 mmol/L (ref 3.5–5.1)
Sodium: 135 mmol/L (ref 135–145)
Total Bilirubin: 0.8 mg/dL (ref 0.3–1.2)
Total Protein: 6.7 g/dL (ref 6.5–8.1)

## 2022-04-27 LAB — IRON AND TIBC
Iron: 48 ug/dL (ref 28–170)
Saturation Ratios: 20 % (ref 10.4–31.8)
TIBC: 241 ug/dL — ABNORMAL LOW (ref 250–450)
UIBC: 193 ug/dL

## 2022-04-27 LAB — SAMPLE TO BLOOD BANK

## 2022-04-27 LAB — FERRITIN: Ferritin: 149 ng/mL (ref 11–307)

## 2022-04-30 MED FILL — Iron Sucrose Inj 20 MG/ML (Fe Equiv): INTRAVENOUS | Qty: 10 | Status: AC

## 2022-05-01 ENCOUNTER — Inpatient Hospital Stay: Payer: Medicare Other | Attending: Oncology | Admitting: Oncology

## 2022-05-01 ENCOUNTER — Encounter: Payer: Self-pay | Admitting: Oncology

## 2022-05-01 ENCOUNTER — Inpatient Hospital Stay: Payer: Medicare Other

## 2022-05-01 VITALS — BP 129/58 | HR 72 | Temp 97.8°F | Resp 16 | Ht 61.0 in | Wt 117.8 lb

## 2022-05-01 DIAGNOSIS — D7589 Other specified diseases of blood and blood-forming organs: Secondary | ICD-10-CM | POA: Diagnosis not present

## 2022-05-01 DIAGNOSIS — C9110 Chronic lymphocytic leukemia of B-cell type not having achieved remission: Secondary | ICD-10-CM | POA: Diagnosis not present

## 2022-05-01 DIAGNOSIS — I10 Essential (primary) hypertension: Secondary | ICD-10-CM | POA: Insufficient documentation

## 2022-05-01 DIAGNOSIS — D649 Anemia, unspecified: Secondary | ICD-10-CM | POA: Diagnosis not present

## 2022-05-01 DIAGNOSIS — Z90710 Acquired absence of both cervix and uterus: Secondary | ICD-10-CM | POA: Insufficient documentation

## 2022-05-01 DIAGNOSIS — Z87891 Personal history of nicotine dependence: Secondary | ICD-10-CM | POA: Diagnosis not present

## 2022-05-01 NOTE — Progress Notes (Signed)
[place]  [phone] [phone]  ID: Stacy Holloway OB: 08/11/1921  MR#: [id number]  [id number]  Patient Care Team: Idelle Crouch, MD as PCP - General (Internal Medicine) Lloyd Huger, MD as Consulting Physician (Hematology and Oncology)  CHIEF COMPLAINT: CLL, anemia.  INTERVAL HISTORY: Patient returns to clinic today for repeat laboratory work and routine 15-month evaluation.  She continues to have chronic weakness and fatigue but lives on her own and remains active.  She has no neurologic complaints.  She denies any fevers or night sweats.  She has a good appetite.  She denies any chest pain, shortness of breath, cough, or hemoptysis.  She denies any nausea, vomiting, constipation, or diarrhea. She has no urinary complaints.  Patient offers no further specific complaints today.  REVIEW OF SYSTEMS:   Review of Systems  Constitutional:  Positive for malaise/fatigue. Negative for fever and weight loss.  Respiratory: Negative.  Negative for cough and shortness of breath.   Cardiovascular: Negative.  Negative for chest pain and leg swelling.  Gastrointestinal: Negative.  Negative for abdominal pain, constipation and heartburn.  Genitourinary: Negative.  Negative for dysuria.  Musculoskeletal: Negative.  Negative for falls and myalgias.  Skin: Negative.  Negative for rash.  Neurological:  Positive for weakness. Negative for focal weakness and headaches.  Psychiatric/Behavioral: Negative.  The patient is not nervous/anxious.     As per HPI. Otherwise, a complete review of systems is negative.  PAST MEDICAL HISTORY: Past Medical History:  Diagnosis Date   Arthritis    Cancer    colon cancer 2009   Cataract    COPD (chronic obstructive pulmonary disease)    Heart disease    Hemorrhoids    Hypertension    Indigestion    Insomnia    Kidney stone    Osteoporosis     PAST SURGICAL HISTORY: Past Surgical History:   Procedure Laterality Date   ABDOMINAL HYSTERECTOMY     APPENDECTOMY     BREAST BIOPSY Left    negative 1982   CHOLECYSTECTOMY     colon cancer     COLON RESECTION     COLONOSCOPY  2008, 2010   TONSILLECTOMY      FAMILY HISTORY: Family History  Problem Relation Age of Onset   Breast cancer Neg Hx     ADVANCED DIRECTIVES (Y/N):  N  HEALTH MAINTENANCE: Social History   Tobacco Use   Smoking status: Former   Smokeless tobacco: Never  Scientific laboratory technician Use: Never used  Substance Use Topics   Alcohol use: No   Drug use: No     Colonoscopy:  PAP:  Bone density:  Lipid panel:  Allergies  Allergen Reactions   Latex Shortness Of Breath and Other (See Comments)   Other Shortness Of Breath and Other (See Comments)    Dental work on teeth- there was something on gloves    Rituxan [Rituximab] Shortness Of Breath    And itching, nasueated   Sulfa Antibiotics Anaphylaxis   Magnesium Diarrhea    Current Outpatient Medications  Medication Sig Dispense Refill   carvedilol (COREG) 6.25 MG tablet Take 6.25 mg by mouth 2 (two) times daily with a meal.     Cholecalciferol (VITAMIN D3) 25 MCG (1000 UT) CAPS Take 1 capsule by mouth daily.     docusate sodium (COLACE) 100 MG capsule Take 100 mg by mouth daily as needed for mild constipation.     famotidine (PEPCID) 40 MG tablet  Take 40 mg by mouth at bedtime.     furosemide (LASIX) 20 MG tablet Take 20 mg by mouth daily.     METAMUCIL FIBER PO Take by mouth.     pantoprazole (PROTONIX) 40 MG tablet Take 40 mg by mouth daily as needed.     ramipril (ALTACE) 2.5 MG capsule Take 2.5 mg by mouth daily.     SIMETHICONE PO Take by mouth.     vitamin B-12 (CYANOCOBALAMIN) 1000 MCG tablet Take 1,000 mcg by mouth daily.     No current facility-administered medications for this visit.    OBJECTIVE: Vitals:   05/01/22 1527  BP: (!) 129/58  Pulse: 72  Resp: 16  Temp: 97.8 F (36.6 C)  SpO2: 98%     Body mass index is 22.26  kg/m.    ECOG FS:0 - Asymptomatic  General: Well-developed, well-nourished, no acute distress. Eyes: Pink conjunctiva, anicteric sclera. HEENT: Normocephalic, moist mucous membranes. Lungs: No audible wheezing or coughing. Heart: Regular rate and rhythm. Abdomen: Soft, nontender, no obvious distention. Musculoskeletal: No edema, cyanosis, or clubbing. Neuro: Alert, answering all questions appropriately. Cranial nerves grossly intact. Skin: No rashes or petechiae noted. Psych: Normal affect.  LAB RESULTS:  Lab Results  Component Value Date   NA 135 04/27/2022   K 4.5 04/27/2022   CL 106 04/27/2022   CO2 23 04/27/2022   GLUCOSE 113 (H) 04/27/2022   BUN 26 (H) 04/27/2022   CREATININE 1.42 (H) 04/27/2022   CALCIUM 8.9 04/27/2022   PROT 6.7 04/27/2022   ALBUMIN 3.6 04/27/2022   AST 21 04/27/2022   ALT 11 04/27/2022   ALKPHOS 49 04/27/2022   BILITOT 0.8 04/27/2022   GFRNONAA 33 (L) 04/27/2022   GFRAA 53 (L) 06/14/2013    Lab Results  Component Value Date   WBC 31.8 (H) 04/27/2022   NEUTROABS 1.2 (L) 04/27/2022   HGB 7.9 (L) 04/27/2022   HCT 24.3 (L) 04/27/2022   MCV 103.8 (H) 04/27/2022   PLT 101 (L) 04/27/2022     STUDIES: CT Renal Stone Study  Result Date: 04/19/2022 CLINICAL DATA:  Left flank pain EXAM: CT ABDOMEN AND PELVIS WITHOUT CONTRAST TECHNIQUE: Multidetector CT imaging of the abdomen and pelvis was performed following the standard protocol without IV contrast. RADIATION DOSE REDUCTION: This exam was performed according to the departmental dose-optimization program which includes automated exposure control, adjustment of the mA and/or kV according to patient size and/or use of iterative reconstruction technique. COMPARISON:  01/14/2013 FINDINGS: Lower chest: No acute abnormality. Hepatobiliary: Stable tiny cyst within the right hepatic dome. The liver is otherwise unremarkable. Status post cholecystectomy. No biliary dilatation. Pancreas: Unremarkable Spleen:  Unremarkable Adrenals/Urinary Tract: The adrenal glands are unremarkable. The kidneys are normal in position. Mild bilateral renal cortical atrophy. Stable simple cortical cyst within the left kidney for which no follow-up imaging is recommended. The kidneys are otherwise unremarkable. Bladder unremarkable. Stomach/Bowel: Severe descending and sigmoid colonic diverticulosis without superimposed acute inflammatory change. Surgical changes of transverse colonic segmental resection are identified without evidence of obstruction. The stomach, small bowel, and large bowel are otherwise unremarkable. No free intraperitoneal gas or fluid. Appendix absent. Vascular/Lymphatic: Moderate aortoiliac atherosclerotic calcification. No aortic aneurysm. Pathologic peripancreatic, mesenteric, left periaortic, and retrocaval adenopathy has developed in the interval with the index nodal conglomerate within the mesenteric root measuring 2.0 x 3.4 cm at axial image # 27/2. This may represent metastatic disease or primary lymphoproliferative process such as lymphoma. Reproductive: Uterus and bilateral adnexa are unremarkable. Other:  No abdominal wall hernia or abnormality. No abdominopelvic ascites. Musculoskeletal: Osseous structures are age-appropriate. No acute bone abnormality. No lytic or blastic bone lesion. IMPRESSION: 1. No acute intra-abdominal pathology identified. 2. Pathologic peripancreatic, mesenteric, left periaortic, and retrocaval adenopathy. This may represent metastatic disease or primary lymphoproliferative process such as lymphoma. If indicated, PET CT examination would be helpful for further evaluation to assess metabolic activity and determine biopsy strategy. 3. Severe distal colonic diverticulosis without superimposed acute inflammatory change. 4. Moderate aortoiliac atherosclerotic calcification. Aortic Atherosclerosis (ICD10-I70.0). Electronically Signed   By: Fidela Salisbury M.D.   On: 04/19/2022 20:20   DG  Chest 2 View  Result Date: 04/19/2022 CLINICAL DATA:  Chest pain and diarrhea in a 89 year old female. EXAM: CHEST - 2 VIEW COMPARISON:  None available. FINDINGS: Cardiomediastinal contours and hilar structures are normal. Lungs are clear. No pneumothorax. No pleural effusion. Lungs with hyperinflation and flattening of RIGHT and LEFT hemidiaphragm. Osteopenia without acute skeletal process on limited assessment. IMPRESSION: No acute cardiopulmonary disease with signs of hyperinflation which can be seen in the setting of COPD. Electronically Signed   By: Zetta Bills M.D.   On: 04/19/2022 18:18    ASSESSMENT: CLL, Rai stage 0  PLAN:   CLL: Chronic and unchanged.  Patient's total white blood cell count has overall trended down since April 2021 when it was reported at 102.7 to her current value of 31.8.  She has ranged from 31.8-49.3 since December 2022.  Patient received minimal Rituxan prior to having a mild reaction and then treatment was discontinued.  No intervention is needed at this time.  Return to clinic in 6 months with repeat laboratory work and evaluation by APP.   Chronic and unchanged.  Patient's platelet count has ranged between 77 and 110 since April 2021.  Today's result is 101. Anemia: Chronic and unchanged.  Patient's hemoglobin has ranged from 6.9-8.5 since April 2021.  Today's result is 7.9.  Iron stores are within normal limits.  She does not require transfusion at this time.  Macrocytosis: Chronic and unchanged.  Previously B12 and folate were within normal limits.     Patient expressed understanding and was in agreement with this plan. She also understands that She can call clinic at any time with any questions, concerns, or complaints.    Lloyd Huger, MD   05/01/2022 4:40 PM    As needed.

## 2022-07-01 NOTE — Progress Notes (Unsigned)
Cardiology Office Note  Date:  07/02/2022   ID:  ALAWNA FREIBERGER, DOB Sep 27, 1921, MRN [id number]  PCP:  Marguarite Arbour, MD   Chief Complaint  Patient presents with   New Patient (Initial Visit)    Establish care for Mitral Valve Disease; former patient of Dr. Gwen Pounds. Patient c/o dizziness, leg weakness and shortness of breath. Medications reviewed by the patient verbally.     HPI:  Ms. Rodneshia Petak is a 89 year old woman with past medical history of CLL with chronic anemia, WBC 25 Mitral valve disease, prolapse Essential hypertension Hyperlipidemia Chronic kidney disease stage III, CR 1.6 Iron deficiency anemia Colon cancer, s/p 8 in resected, Severe distal colonic diverticulosis  CT scan 3/24: Moderate aortoiliac atherosclerotic calcification.  Who presents by referral from Dr. Judithann Sheen for evaluation of her mitral valve disease  Last seen by cardiology at [facility] August 28, 2021 At that time was reported to have nonrheumatologic mitral valve regurgitation  Last echocardiogram 2015, normal EF, mild MR  Moving to [facility] July 2024 Moving to Facility  In the ER 04/19/22: diarrhea, gas pain Was constipated, then took miralex  Previously seen by oncology, Dr. Orlie Dakin  incontinence, wears a pad  EKG personally reviewed by myself on todays visit NSR rate 69 bpm, no ST or T wave changes   PMH:   has a past medical history of Arthritis, Cancer (HCC), Cataract, COPD (chronic obstructive pulmonary disease) (HCC), Heart disease, Hemorrhoids, Hypertension, Indigestion, Insomnia, Kidney stone, and Osteoporosis.  PSH:    Past Surgical History:  Procedure Laterality Date   ABDOMINAL HYSTERECTOMY     APPENDECTOMY     BREAST BIOPSY Left    negative 1982   CHOLECYSTECTOMY     colon cancer     COLON RESECTION     COLONOSCOPY  2008, 2010   TONSILLECTOMY      Current Outpatient Medications  Medication Sig Dispense Refill   carvedilol (COREG) 6.25 MG tablet Take 6.25 mg by  mouth 2 (two) times daily with a meal.     Cholecalciferol (VITAMIN D3) 25 MCG (1000 UT) CAPS Take 1 capsule by mouth daily.     docusate sodium (COLACE) 100 MG capsule Take 100 mg by mouth daily as needed for mild constipation.     famotidine (PEPCID) 40 MG tablet Take 40 mg by mouth at bedtime.     METAMUCIL FIBER PO Take by mouth.     ramipril (ALTACE) 2.5 MG capsule Take 2.5 mg by mouth daily.     SIMETHICONE PO Take by mouth.     vitamin B-12 (CYANOCOBALAMIN) 1000 MCG tablet Take 1,000 mcg by mouth daily.     No current facility-administered medications for this visit.    Allergies:   Latex, Other, Rituxan [rituximab], Sulfa antibiotics, and Magnesium   Social History:  The patient  reports that she has quit smoking. She has never used smokeless tobacco. She reports that she does not drink alcohol and does not use drugs.   Family History:   family history is not on file.    Review of Systems: Review of Systems  Constitutional: Negative.   HENT: Negative.    Respiratory: Negative.    Cardiovascular: Negative.   Gastrointestinal: Negative.   Musculoskeletal: Negative.   Neurological: Negative.   Psychiatric/Behavioral: Negative.    All other systems reviewed and are negative.   PHYSICAL EXAM: VS:  BP (!) 142/62 (BP Location: Right Arm, Patient Position: Sitting, Cuff Size: Normal)   Pulse 69  Ht 5' (1.524 m)   Wt 117 lb 6 oz (53.2 kg)   SpO2 96%   BMI 22.92 kg/m  , BMI Body mass index is 22.92 kg/m. GEN: Well nourished, well developed, in no acute distress HEENT: normal Neck: no JVD, carotid bruits, or masses Cardiac: RRR; no murmurs, rubs, or gallops,no edema  Respiratory:  clear to auscultation bilaterally, normal work of breathing GI: soft, nontender, nondistended, + BS MS: no deformity or atrophy Skin: warm and dry, no rash Neuro:  Strength and sensation are intact Psych: euthymic mood, full affect  Recent Labs: 04/27/2022: ALT 11; BUN 26; Creatinine, Ser  1.42; Hemoglobin 7.9; Platelets 101; Potassium 4.5; Sodium 135    Lipid Panel No results found for: "CHOL", "HDL", "LDLCALC", "TRIG"    Wt Readings from Last 3 Encounters:  07/02/22 117 lb 6 oz (53.2 kg)  05/01/22 117 lb 12.8 oz (53.4 kg)  04/19/22 119 lb (54 kg)     ASSESSMENT AND PLAN:  Problem List Items Addressed This Visit       Cardiology Problems   Benign essential hypertension   Relevant Orders   EKG 12-Lead   Hyperlipidemia   Mitral valve disease - Primary   Relevant Orders   EKG 12-Lead     Other   CLL (chronic lymphocytic leukemia) (HCC)   Relevant Orders   EKG 12-Lead   Mitral valve regurgitation Last echo in the system 2015, mild MR No significant murmur on exam, no edema, Denies SOB on exertion, currently asymptomatic No further workup needed at this time, not a surgical candidate for worsening valvular disease given age  CLL: chonic anemia, HGB 8, stable, Followed by oncology  HTN: Blood pressure high end of range. No changes made to the medications.  CRI: CR 1.6, high end of her range Increase fluid intake  Moderate aortoiliac atherosclerotic calcification  on CT scan Not on a statin No recent lipid panel, Priro total chol 175, LDL 95, seven years ago  Hx of vertigo attacks Better now, resolved without intervention    Total encounter time more than 60 minutes  Greater than 50% was spent in counseling and coordination of care with the patient    Signed, Dossie Arbour, M.D., Ph.D. [facility] [place], [place] [phone]

## 2022-07-02 ENCOUNTER — Ambulatory Visit: Payer: Medicare Other | Attending: Cardiovascular Disease | Admitting: Cardiovascular Disease

## 2022-07-02 ENCOUNTER — Encounter: Payer: Self-pay | Admitting: Cardiovascular Disease

## 2022-07-02 VITALS — BP 142/62 | HR 69 | Ht 60.0 in | Wt 117.4 lb

## 2022-07-02 DIAGNOSIS — I059 Rheumatic mitral valve disease, unspecified: Secondary | ICD-10-CM | POA: Diagnosis not present

## 2022-07-02 DIAGNOSIS — C9110 Chronic lymphocytic leukemia of B-cell type not having achieved remission: Secondary | ICD-10-CM

## 2022-07-02 DIAGNOSIS — I10 Essential (primary) hypertension: Secondary | ICD-10-CM | POA: Diagnosis not present

## 2022-07-02 DIAGNOSIS — E782 Mixed hyperlipidemia: Secondary | ICD-10-CM

## 2022-07-02 NOTE — Patient Instructions (Signed)
Medication Instructions:  No changes  If you need a refill on your cardiac medications before your next appointment, please call your pharmacy.   Lab work: No new labs needed  Testing/Procedures: No new testing needed  Follow-Up: At [facility], you and your health needs are our priority.  As part of our continuing mission to provide you with exceptional heart care, we have created designated Provider Care Teams.  These Care Teams include your primary Cardiologist (physician) and Advanced Practice Providers (APPs -  Physician Assistants and Nurse Practitioners) who all work together to provide you with the care you need, when you need it.  You will need a follow up appointment in 12 months  Providers on your designated Care Team:   Christopher Berge, NP Ryan Dunn, PA-C Cadence Furth, PA-C  COVID-19 Vaccine Information can be found at: [email or web address] For questions related to vaccine distribution or appointments, please email [email or web address] or call [phone].   

## 2022-10-18 ENCOUNTER — Encounter: Payer: Self-pay | Admitting: Oncology

## 2022-10-30 ENCOUNTER — Other Ambulatory Visit: Payer: Medicare Other

## 2022-10-30 ENCOUNTER — Ambulatory Visit: Payer: Medicare Other | Admitting: Nurse Practitioner

## 2022-10-31 ENCOUNTER — Ambulatory Visit: Payer: Medicare Other | Admitting: Medical Oncology

## 2022-10-31 ENCOUNTER — Other Ambulatory Visit: Payer: Medicare Other

## 2022-11-30 DEATH — deceased
